# Patient Record
Sex: Male | Born: 1975
Health system: Southern US, Community
[De-identification: ages and names within clinical notes are randomized; demographics above are authoritative.]

## PROBLEM LIST (undated history)

## (undated) DIAGNOSIS — F419 Anxiety disorder, unspecified: Secondary | ICD-10-CM

## (undated) DIAGNOSIS — F32A Depression, unspecified: Secondary | ICD-10-CM

## (undated) DIAGNOSIS — F329 Major depressive disorder, single episode, unspecified: Secondary | ICD-10-CM

## (undated) HISTORY — PX: APPENDECTOMY: SHX54

## (undated) HISTORY — DX: Major depressive disorder, single episode, unspecified: F32.9

## (undated) HISTORY — DX: Anxiety disorder, unspecified: F41.9

## (undated) HISTORY — DX: Depression, unspecified: F32.A

## (undated) HISTORY — PX: WISDOM TOOTH EXTRACTION: SHX21

---

## 2015-12-14 ENCOUNTER — Encounter (HOSPITAL_COMMUNITY): Payer: Self-pay

## 2016-01-14 NOTE — Progress Notes (Signed)
Psychiatric Initial Adult Assessment   Patient Identification: Angel Bell MRN:  [id number] Date of Evaluation:  01/17/2016 Referral Source: self Chief Complaint:   Chief Complaint    Anxiety; New Evaluation    "I've been a worrier" Visit Diagnosis:    ICD-9-CM ICD-10-CM   1. Generalized anxiety disorder 300.02 F41.1     History of Present Illness:   Angel Bell is a 40 year old male with anxiety, presented here to transition care to [place].   He states that he has been feeling anxious since age 72. He has been on Xanax for 14 years and wishes to be weaned off this medication. He feels more anxious when he has tried to discontinue Xanax. He moved from [place] to here due to work and he feels pressure that he needs to do well at work. Although he has been seen at [facility] for 3 months, he wishes to transition care to here. He endorses insomnia, difficulty concentration, irritability and racing thought, which has been worse over the past few months. He denies any significant difficulty at work. He tends to isolate himself and wishes to be by himself, although he admits that he might feel anxious to be with other people. He also reports conflict with his parents due to his sexual orientation.   He denies anhedonia, and enjoys watching TV or travel. He denies SI.and denies previous suicide attempt. He denies AH/VH. He denies panic attack. He denies decreased need for sleep. He drinks occasionally, last drink a month ago. He denies drug use.   Associated Signs/Symptoms: Depression Symptoms:  insomnia, difficulty concentrating, anxiety, (Hypo) Manic Symptoms:  denies Anxiety Symptoms:  Excessive Worry, Psychotic Symptoms:  denies PTSD Symptoms: Negative  Past Psychiatric History:  Outpatient: psychiatrist in [place], used to see [facility] for 3 months Psychiatry admission: never Previous suicide attempt: denies Past trials of medication: Remeron, Prozac, Amitriptyline  (insomnia), gabapentin, Alprazolam, hydroxyzine, melatonin,  Previous Psychotropic Medications: Yes   Substance Abuse History in the last 12 months:  No.  Consequences of Substance Abuse: Negative  Past Medical History:  Past Medical History:  Diagnosis Date  . Anxiety   . Depression     Past Surgical History:  Procedure Laterality Date  . APPENDECTOMY    . WISDOM TOOTH EXTRACTION      Family Psychiatric History: mother- depression, anxiety,   Family History:  Family History  Problem Relation Age of Onset  . Depression Mother     Social History:   Social History   Social History  . Marital status: Single    Spouse name: N/A  . Number of children: N/A  . Years of education: N/A   Social History Main Topics  . Smoking status: Former Smoker    Packs/day: 0.50    Types: Cigarettes    Quit date: 01/16/2009  . Smokeless tobacco: Never Used  . Alcohol use No  . Drug use: No  . Sexual activity: Not Currently   Other Topics Concern  . None   Social History Narrative  . 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 BIOGAIA PROBIOTIC (BIOGAIA PROBIOTIC) LIQD Take  by mouth daily at 8 pm.    . cetirizine (ZYRTEC) 10 MG tablet Take 10 mg by mouth daily.    . cycloSPORINE (RESTASIS) 0.05 % ophthalmic emulsion 1 drop 2 (two) times daily.    Marland Kitchen ezetimibe-simvastatin (VYTORIN) 10-10 MG tablet Take 1 tablet by mouth at bedtime.    . fenofibrate (TRICOR) 145 MG tablet Take 145 mg by mouth daily.    . Flaxseed, Linseed, (FLAXSEED OIL) 1000 MG CAPS Take by mouth.    . gabapentin (NEURONTIN)  300 MG capsule Take 300 mg by mouth 3 (three) times daily.    . hydrOXYzine (ATARAX/VISTARIL) 25 MG tablet Take 25 mg by mouth 2 (two) times daily as needed.    . Multiple Vitamin (MULTIVITAMIN) tablet Take 1 tablet by mouth daily.    . Omega-3 Fatty Acids (FISH OIL) 1000 MG CPDR Take by mouth.    . pentosan polysulfate (ELMIRON) 100 MG capsule Take 100 mg by mouth 3 (three) times daily.    Marland Kitchen triamterene-hydrochlorothiazide (DYAZIDE) 37.5-25 MG capsule Take 1 capsule by mouth daily.    . DULoxetine (CYMBALTA) 30 MG capsule Take 30 mg daily for two weeks, then 60 mg daily 60 capsule 1  . LORazepam (ATIVAN) 0.5 MG tablet Take 1 tablet (0.5 mg total) by mouth 2 (two) times daily. 60 tablet 0   No current facility-administered medications for this visit.     Neurologic: Headache: Yes Seizure: No Paresthesias:No  Musculoskeletal: Strength & Muscle Tone: within normal limits Gait & Station: normal Patient leans: N/A  Psychiatric Specialty Exam: Review of Systems  Musculoskeletal: Positive for back pain.  Neurological: Positive for headaches.  Psychiatric/Behavioral: Negative for depression, hallucinations, substance abuse and suicidal ideas. The patient is nervous/anxious and has insomnia.   All other systems reviewed and are negative.   Blood pressure 130/78, pulse 85, height 5\' 8"  (1.727 m), weight 252 lb 12.8 oz (114.7 kg).Body mass index is 38.44 kg/m.  General Appearance: Well Groomed  Eye Contact:  Good  Speech:  Clear and Coherent  Volume:  Normal  Mood:  Anxious  Affect:  anxious, down  Thought Process:  Coherent and Goal Directed  Orientation:  Full (Time, Place, and Person)  Thought Content:  Logical  Suicidal Thoughts:  No  Homicidal Thoughts:  No  Memory:  Immediate;   Good Recent;   Good Remote;   Good  Judgement:  Good  Insight:  Fair  Psychomotor Activity:  Normal  Concentration:  Concentration: Good and Attention Span: Good  Recall:  Good  Fund of  Knowledge:Good  Language: Good  Akathisia:  No  Handed:  Right  AIMS (if indicated):  N/A  Assets:  Communication Skills Desire for Improvement  ADL's:  Intact  Cognition: WNL  Sleep:  poor   Assessment Angel Bell is a 40 year old with anxiety, presented here to transition care to [place].   # GAD His clinical course is consistent with GAD. Psychosocial stressors including work environment. Will start duloxetine to target his anxiety; hopefully it will be  beneficial for his headache as well. May consider uptitration of gabapentin in the future if he continues to endorse anxiety. Will switch from Xanax to Lorazepam with plan to taper off, given his history of difficulty to wean off this medication. Will discontinue amitriptyline (prescribed for insomnia per report) to avoid polypharmacy. He will greatly benefit from CBT; patient is scheduled to see a therapist.   Plan 1. Start duloxetine 30 mg daily for two weeks,  then 60 mg daily 2. Start lorazepam 0.5 mg twice a day as needed for anxiety 3. Discontinue Xanax 4. Continue Gabapentin 300 mg at night 5. Discontinue hydroxyzine, amitriptyline 6. Return to clinic in one month 7. Patient will see a therapist  Treatment Plan Summary: Plan as above  The patient demonstrates the following risk factors for suicide: Chronic risk factors for suicide include: psychiatric disorder of anxiety. Acute risk factors for suicide include: family or marital conflict. Protective factors for this patient include: coping skills and hope for the future. Considering these factors, the overall suicide risk at this point appears to be low. Patient is appropriate for outpatient follow up.   Norman Clay, MD 10/16/201710:16 AM

## 2016-01-17 ENCOUNTER — Encounter (HOSPITAL_COMMUNITY): Payer: Self-pay | Admitting: Psychiatry

## 2016-01-17 ENCOUNTER — Encounter (INDEPENDENT_AMBULATORY_CARE_PROVIDER_SITE_OTHER): Payer: Self-pay

## 2016-01-17 ENCOUNTER — Ambulatory Visit (INDEPENDENT_AMBULATORY_CARE_PROVIDER_SITE_OTHER): Payer: Commercial Managed Care - HMO | Admitting: Psychiatry

## 2016-01-17 VITALS — BP 130/78 | HR 85 | Ht 68.0 in | Wt 252.8 lb

## 2016-01-17 DIAGNOSIS — Z87891 Personal history of nicotine dependence: Secondary | ICD-10-CM

## 2016-01-17 DIAGNOSIS — F411 Generalized anxiety disorder: Secondary | ICD-10-CM | POA: Diagnosis not present

## 2016-01-17 DIAGNOSIS — Z79899 Other long term (current) drug therapy: Secondary | ICD-10-CM | POA: Diagnosis not present

## 2016-01-17 DIAGNOSIS — Z818 Family history of other mental and behavioral disorders: Secondary | ICD-10-CM | POA: Diagnosis not present

## 2016-01-17 MED ORDER — DULOXETINE HCL 30 MG PO CPEP: ORAL_CAPSULE | ORAL | 1 refills | Status: DC

## 2016-01-17 MED ORDER — LORAZEPAM 0.5 MG PO TABS: 0.5000 mg | ORAL_TABLET | Freq: Two times a day (BID) | ORAL | 0 refills | Status: DC

## 2016-01-17 NOTE — Patient Instructions (Addendum)
1. Start duloxetine 30 mg daily for two weeks, then 60 mg daily 2. Start lorazepam 0.5 mg twice a day as needed for anxiety 3. Discontinue Xanax 4. Continue Gabapentin 300 mg at night 5. Discontinue hydroxyzine 6. Return to clinic in one month

## 2016-01-18 ENCOUNTER — Ambulatory Visit (INDEPENDENT_AMBULATORY_CARE_PROVIDER_SITE_OTHER): Payer: Commercial Managed Care - HMO | Admitting: Psychiatry

## 2016-01-18 DIAGNOSIS — F411 Generalized anxiety disorder: Secondary | ICD-10-CM

## 2016-01-18 NOTE — Progress Notes (Signed)
Comprehensive Clinical Assessment (CCA) Note  01/18/2016 Angel Bell [id number]  Visit Diagnosis:   No diagnosis found.    CCA Part One  Part One has been completed on paper by the patient.  (See scanned document in Chart Review)  CCA Part Two A  Intake/Chief Complaint:  CCA Intake With Chief Complaint CCA Part Two Date: 01/18/16 Chief Complaint/Presenting Problem: anxiety; social anxiety Patients Currently Reported Symptoms/Problems: obsessions; compulsions Collateral Involvement: none Individual's Strengths: enjoys work as a Loss adjuster, chartered of a Research scientist (medical) Abilities: creative Type of Services Patient Feels Are Needed: medication management and individual therapy Initial Clinical Notes/Concerns: social anxiety; poor community networks; introvert and tends to isolate  Mental Health Symptoms Depression:  Depression: Difficulty Concentrating, Fatigue, Irritability, Sleep (too much or little), Tearfulness (6 hours of interrupted sleep)  Mania:  Mania: N/A  Anxiety:   Anxiety: Worrying, Tension, Sleep, Restlessness, Irritability, Fatigue, Difficulty concentrating  Psychosis:  Psychosis: N/A  Trauma:  Trauma: N/A  Obsessions:  Obsessions: Cause anxiety (somatic concerns; obsesses about body pains)  Compulsions:  Compulsions: N/A  Inattention:  Inattention: N/A  Hyperactivity/Impulsivity:  Hyperactivity/Impulsivity: N/A  Oppositional/Defiant Behaviors:  Oppositional/Defiant Behaviors: N/A  Borderline Personality:  Emotional Irregularity: N/A  Other Mood/Personality Symptoms:      Mental Status Exam Appearance and self-care  Stature:  Stature: Average  Weight:  Weight: Overweight  Clothing:  Clothing: Casual  Grooming:  Grooming: Normal  Cosmetic use:  Cosmetic Use: Age appropriate  Posture/gait:  Posture/Gait: Normal  Motor activity:  Motor Activity: Not Remarkable  Sensorium  Attention:  Attention: Normal  Concentration:  Concentration: Normal   Orientation:  Orientation: X5  Recall/memory:  Recall/Memory: Normal  Affect and Mood  Affect:  Affect: Anxious  Mood:  Mood: Anxious  Relating  Eye contact:  Eye Contact: Normal  Facial expression:  Facial Expression: Constricted  Attitude toward examiner:  Attitude Toward Examiner: Cooperative  Thought and Language  Speech flow: Speech Flow: Normal  Thought content:  Thought Content: Appropriate to mood and circumstances  Preoccupation:     Hallucinations:     Organization:     Transport planner of Knowledge:  Fund of Knowledge: Average  Intelligence:  Intelligence: Average  Abstraction:  Abstraction: Normal  Judgement:  Judgement: Normal  Reality Testing:  Reality Testing: Adequate  Insight:  Insight: Fair  Decision Making:  Decision Making: Normal  Social Functioning  Social Maturity:  Social Maturity: Isolates  Social Judgement:  Social Judgement: Normal  Stress  Stressors:  Stressors: Work (Environmental consultant)  Coping Ability:     Skill Deficits:     Supports:      Family and Psychosocial History: Family history Marital status: Single Are you sexually active?: No What is your sexual orientation?: homosexual Does patient have children?: No  Childhood History:  Childhood History By whom was/is the patient raised?: Both parents Description of patient's relationship with caregiver when they were a child: good  Patient's description of current relationship with people who raised him/her: very close with parents Does patient have siblings?: Yes Number of Siblings: 1 Description of patient's current relationship with siblings: good relationship with brother Did patient suffer any verbal/emotional/physical/sexual abuse as a child?: No Did patient suffer from severe childhood neglect?: No Has patient ever been sexually abused/assaulted/raped as an adolescent or adult?: No Was the patient ever a victim of a crime or a disaster?: No Witnessed domestic violence?:  No Has patient been effected by domestic violence as an adult?: No  CCA  Part Two B  Employment/Work Situation: Employment / Work Situation Employment situation: Employed Where is patient currently employed?: [employer]  How long has patient been employed?: 22 years Patient's job has been impacted by current illness: Yes Describe how patient's job has been impacted: very anxious about job performance and how he is perceived by others What is the longest time patient has a held a job?: current job Where was the patient employed at that time?: [employer] Has patient ever been in the [employer]?: No Has patient ever served in combat?: No Did You Receive Any Psychiatric Treatment/Services While in Passenger transport manager?: No Are There Guns or Other Weapons in [place]?: No  Education: Education Did Teacher, adult education From [employer]?: Yes Did [place]?: Yes What Type of College Degree Do you Have?: B.A. What Was Your Major?: art Did You Have Any Special Interests In School?: art Did You Have An Individualized Education Program (IIEP): No Did You Have Any Difficulty At School?: No  Religion: Religion/Spirituality Are You A Religious Person?: Yes What is Your Religious Affiliation?: Christian  Leisure/Recreation: Leisure / Recreation Leisure and Hobbies: travel; Oceanographer; internet  Exercise/Diet: Exercise/Diet Do You Exercise?: Yes What Type of Exercise Do You Do?: Run/Walk How Many Times a Week Do You Exercise?: 4-5 times a week Have You Gained or Lost A Significant Amount of Weight in the Past Six Months?: No Do You Follow a Special Diet?: No Do You Have Any Trouble Sleeping?: Yes Explanation of Sleeping Difficulties: about 6 hours interrupted  CCA Part Two C  Alcohol/Drug Use: Alcohol / Drug Use Pain Medications: none Over the Counter: allergy medication daily; blueberry; flaxseed; fishoil; prelief for bladder History of alcohol / drug use?: No history of alcohol / drug abuse                       CCA Part Three  ASAM's:  Six Dimensions of Multidimensional Assessment  Dimension 1:  Acute Intoxication and/or Withdrawal Potential:     Dimension 2:  Biomedical Conditions and Complications:     Dimension 3:  Emotional, Behavioral, or Cognitive Conditions and Complications:     Dimension 4:  Readiness to Change:     Dimension 5:  Relapse, Continued use, or Continued Problem Potential:     Dimension 6:  Recovery/Living Environment:      Substance use Disorder (SUD)    Social Function:  Social Functioning Social Maturity: Isolates Social Judgement: Normal  Stress:  Stress Stressors: Work (Environmental consultant) Patient Takes Medications The Way The Doctor Instructed?: Yes Priority Risk: Low Acuity  Risk Assessment- Self-Harm Potential: Risk Assessment For Self-Harm Potential Thoughts of Self-Harm: No current thoughts Method: No plan Availability of Means: No access/NA  Risk Assessment -Dangerous to Others Potential: Risk Assessment For Dangerous to Others Potential Method: No Plan Availability of Means: No access or NA Intent: Vague intent or NA Notification Required: No need or identified person  DSM5 Diagnoses: Patient Active Problem List   Diagnosis Date Noted  . Generalized anxiety disorder 01/17/2016    Patient Centered Plan: Patient is on the following Treatment Plan(s):  To be completed by individual therapist.  Recommendations for Services/Supports/Treatments: Recommendations for Services/Supports/Treatments Recommendations For Services/Supports/Treatments: Medication Management, Individual Therapy  Treatment Plan Summary:    Referrals to Alternative Service(s): Referred to Alternative Service(s):   Place:   Date:   Time:    Referred to Alternative Service(s):   Place:   Date:   Time:  Referred to Alternative Service(s):   Place:   Date:   Time:    Referred to Alternative Service(s):   Place:   Date:   Time:     Nancie Neas

## 2016-01-26 ENCOUNTER — Other Ambulatory Visit: Payer: Self-pay | Admitting: Family Medicine

## 2016-01-26 DIAGNOSIS — R740 Nonspecific elevation of levels of transaminase and lactic acid dehydrogenase [LDH]: Principal | ICD-10-CM

## 2016-01-26 DIAGNOSIS — R7401 Elevation of levels of liver transaminase levels: Secondary | ICD-10-CM

## 2016-02-16 ENCOUNTER — Ambulatory Visit (HOSPITAL_COMMUNITY): Payer: Commercial Managed Care - HMO | Admitting: Psychiatry

## 2016-02-16 NOTE — Progress Notes (Signed)
[place] MD/PA/NP OP Progress Note  02/21/2016 9:38 AM Angel Bell  MRN:  [id number]  Chief Complaint:  Chief Complaint    Anxiety; Follow-up     Subjective:  "I feel a lot better." HPI:  Patient presents for follow up appointment. He states that his mood has improved significantly and denies any panic attack. He is able to deal things well at work. Although he tends to be by himself outside work, he is content. He has been taking Ativan twice a day (did not recognize prn prescription).   He occasionally had insomnia. He denies SI. He drinks alcohol a couple of beers very occasionally. He denies any change since discontinuing Xanax, hydroxyzine and amitriptyline. He continues to take gabapentine 300 mg TID to prevent biting a tongue.   Visit Diagnosis:    ICD-9-CM ICD-10-CM   1. Generalized anxiety disorder 300.02 F41.1     Past Psychiatric History:  Outpatient: psychiatrist in [place], used to see [facility] for 3 months Psychiatry admission: never Previous suicide attempt: denies Past trials of medication: Remeron, Prozac, Amitriptyline (insomnia), gabapentin, Alprazolam, hydroxyzine, melatonin,  Past Medical History:  Past Medical History:  Diagnosis Date  . Anxiety   . Depression     Past Surgical History:  Procedure Laterality Date  . APPENDECTOMY    . WISDOM TOOTH EXTRACTION      Family Psychiatric History:  mother- depression, anxiety,   Family History:  Family History  Problem Relation Age of Onset  . Depression Mother     Social History:  Social History   Social History  . Marital status: Single    Spouse name: N/A  . Number of children: N/A  . Years of education: N/A   Social History Main Topics  . Smoking status: Former Smoker    Packs/day: 0.50    Types: Cigarettes    Quit date: 01/16/2009  . Smokeless tobacco: Never Used  . Alcohol use No  . Drug use: No  . Sexual activity: Not Currently   Other Topics Concern  . None   Social  History Narrative  . None    Allergies: No Known Allergies  Metabolic Disorder Labs: No results found for: HGBA1C, MPG No results found for: PROLACTIN No results found for: CHOL, TRIG, HDL, CHOLHDL, VLDL, LDLCALC   Current Medications: Current Outpatient Prescriptions  Medication Sig Dispense Refill  . atorvastatin (LIPITOR) 10 MG tablet Take 10 mg by mouth daily.  3  . BIOGAIA PROBIOTIC (BIOGAIA PROBIOTIC) LIQD Take by mouth daily at 8 pm.    . cetirizine (ZYRTEC) 10 MG tablet Take 10 mg by mouth daily.    . cycloSPORINE (RESTASIS) 0.05 % ophthalmic emulsion 1 drop 2 (two) times daily.    . DULoxetine (CYMBALTA) 60 MG capsule Take 30 mg daily for two weeks, then 60 mg daily 90 capsule 0  . ezetimibe-simvastatin (VYTORIN) 10-10 MG tablet Take 1 tablet by mouth at bedtime.    . fenofibrate (TRICOR) 145 MG tablet Take 145 mg by mouth daily.    . Flaxseed, Linseed, (FLAXSEED OIL) 1000 MG CAPS Take by mouth.    . gabapentin (NEURONTIN) 300 MG capsule Take 300 mg by mouth 3 (three) times daily.    Marland Kitchen LORazepam (ATIVAN) 0.5 MG tablet Take 1 tablet (0.5 mg total) by mouth 2 (two) times daily as needed for anxiety. 30 tablet 0  . Multiple Vitamin (MULTIVITAMIN) tablet Take 1 tablet by mouth daily.    . Omega-3 Fatty Acids (FISH OIL) 1000 MG CPDR  Take by mouth.    . pentosan polysulfate (ELMIRON) 100 MG capsule Take 100 mg by mouth 3 (three) times daily.    Marland Kitchen triamterene-hydrochlorothiazide (DYAZIDE) 37.5-25 MG capsule Take 1 capsule by mouth daily.    Marland Kitchen ezetimibe (ZETIA) 10 MG tablet Take 10 mg by mouth daily.  2   No current facility-administered medications for this visit.     Neurologic: Headache: No Seizure: No Paresthesias: No  Musculoskeletal: Strength & Muscle Tone: within normal limits Gait & Station: normal Patient leans: N/A  Psychiatric Specialty Exam: Review of Systems  Musculoskeletal: Positive for joint pain.  Psychiatric/Behavioral: Negative for depression,  hallucinations, substance abuse and suicidal ideas. The patient has insomnia. The patient is not nervous/anxious.   All other systems reviewed and are negative.   Blood pressure 130/82, pulse (!) 102, height 5\' 8"  (1.727 m), weight 251 lb (113.9 kg).Body mass index is 38.16 kg/m.  General Appearance: Casual  Eye Contact:  Good  Speech:  Normal Rate  Volume:  Normal  Mood:  "good"\  Affect:  less anxious  Thought Process:  Coherent and Goal Directed  Orientation:  Full (Time, Place, and Person)  Thought Content: Logical  Perceptions: denies AH/VH  Suicidal Thoughts:  No  Homicidal Thoughts:  No  Memory:  Immediate;   Good Recent;   Good Remote;   Good  Judgement:  Good  Insight:  Good  Psychomotor Activity:  Normal  Concentration:  Concentration: Good and Attention Span: Good  Recall:  Good  Fund of Knowledge: Good  Language: Good  Akathisia:  NA  Handed:  Right  AIMS (if indicated):  N/A  Assets:  Communication Skills Desire for Improvement  ADL's:  Intact  Cognition: WNL  Sleep:  fair   Assessment Angel Bell is a 40 year old with anxiety, presented here to transition care to [place]. Psychosocial stressors including work environment.   # GAD There has been significant improvement in his anxiety which coincided with starting duloxetine. Will continue current dose. Will plan to taper off Ativan given he denies significant anxiety. He will greatly benefit from CBT; patient is scheduled to see a therapist.   Plan 1. Continue duloxetine 60 mg daily 2. 2. Decrease lorazepam 0.5 mg daily for one week, then discontinue (Patient is on Gabapentin 300 mg at night) 3. Return to clinic in one month 4. Patient will see a therapist  Treatment Plan Summary: Plan as above  The patient demonstrates the following risk factors for suicide: Chronic risk factors for suicide include: psychiatric disorder of anxiety. Acute risk factors for suicide include: family or marital  conflict. Protective factors for this patient include: coping skills and hope for the future. Considering these factors, the overall suicide risk at this point appears to be low. Patient is appropriate for outpatient follow up.  Norman Clay, MD 02/21/2016, 9:38 AM

## 2016-02-18 ENCOUNTER — Telehealth (HOSPITAL_COMMUNITY): Payer: Self-pay

## 2016-02-18 ENCOUNTER — Other Ambulatory Visit (HOSPITAL_COMMUNITY): Payer: Self-pay | Admitting: Psychiatry

## 2016-02-18 MED ORDER — LORAZEPAM 0.5 MG PO TABS: 0.5000 mg | ORAL_TABLET | Freq: Two times a day (BID) | ORAL | 0 refills | Status: DC | PRN

## 2016-02-18 NOTE — Telephone Encounter (Signed)
Medication refill - Telephone call with patient to inform his order was called into his pharmacy and reminded patient of our new outpatient address for appointment 02/21/16.

## 2016-02-18 NOTE — Telephone Encounter (Signed)
I called in the pharmacy. Ordered Ativan 0.5 mg BID prn for anxiety, dispense for 30 tabs. Could you inform the patient? Thanks!  [id number] - [place], [place] [address] AT [address]

## 2016-02-18 NOTE — Telephone Encounter (Signed)
Medication refill - Patient called requesting a refill of his Ativan as states he does not have enough until follow up appointment 02/21/16.Stated he has 1 pill remaining and needs an order to be called into [facility]. Medication refill - Patient called requesting a refill of his Ativan as states he does not have enough until follow up appointment 02/21/16.Stated he has 1 pill remaining and needs an order to be called into his [facility] pharmacy. States he plans to keep appointment on 02/21/16.

## 2016-02-21 ENCOUNTER — Ambulatory Visit (INDEPENDENT_AMBULATORY_CARE_PROVIDER_SITE_OTHER): Payer: Commercial Managed Care - HMO | Admitting: Psychiatry

## 2016-02-21 ENCOUNTER — Encounter (HOSPITAL_COMMUNITY): Payer: Self-pay | Admitting: Psychiatry

## 2016-02-21 VITALS — BP 130/82 | HR 102 | Ht 68.0 in | Wt 251.0 lb

## 2016-02-21 DIAGNOSIS — Z818 Family history of other mental and behavioral disorders: Secondary | ICD-10-CM

## 2016-02-21 DIAGNOSIS — Z87891 Personal history of nicotine dependence: Secondary | ICD-10-CM | POA: Diagnosis not present

## 2016-02-21 DIAGNOSIS — Z79899 Other long term (current) drug therapy: Secondary | ICD-10-CM | POA: Diagnosis not present

## 2016-02-21 DIAGNOSIS — F411 Generalized anxiety disorder: Secondary | ICD-10-CM

## 2016-02-21 MED ORDER — LORAZEPAM 0.5 MG PO TABS: 0.5000 mg | ORAL_TABLET | Freq: Two times a day (BID) | ORAL | 0 refills | Status: DC | PRN

## 2016-02-21 MED ORDER — DULOXETINE HCL 60 MG PO CPEP: ORAL_CAPSULE | ORAL | 0 refills | Status: DC

## 2016-02-21 NOTE — Patient Instructions (Addendum)
1. Continue duloxetine 60 mg daily 2. Decrease lorazepam 0.5 mg daily for one week, then discontinue 3. Return to clinic in one month

## 2016-03-08 ENCOUNTER — Ambulatory Visit (HOSPITAL_COMMUNITY): Payer: Commercial Managed Care - HMO | Admitting: Psychiatry

## 2016-03-20 ENCOUNTER — Other Ambulatory Visit (HOSPITAL_COMMUNITY): Payer: Self-pay | Admitting: Psychiatry

## 2016-03-20 ENCOUNTER — Telehealth (HOSPITAL_COMMUNITY): Payer: Self-pay

## 2016-03-20 MED ORDER — GABAPENTIN 300 MG PO CAPS: 300.0000 mg | ORAL_CAPSULE | Freq: Every day | ORAL | 0 refills | Status: DC

## 2016-03-20 MED ORDER — GABAPENTIN 300 MG PO CAPS: 300.0000 mg | ORAL_CAPSULE | Freq: Three times a day (TID) | ORAL | 0 refills | Status: DC

## 2016-03-20 NOTE — Telephone Encounter (Signed)
Patient takes 300 mg Gabapentin at bedtime, we have never filled it because he has always had enough. Patient states he now needs the refill, please review and advise, thank you

## 2016-03-20 NOTE — Telephone Encounter (Signed)
Called patient back and confirmed with him that he is taking 300 mg at night only. I sent the order to the pharmacy, patients insurance requires a 90 day or they will not cover, so I sent in a 90 day.

## 2016-03-20 NOTE — Telephone Encounter (Signed)
Please verify gabapentin dose with patient; I believe that he is taking  300 mg TID when I saw him last (I missed to update it in my previous note.) I ordered gabapentin 300 mg TID for a month. If he is only taking 300 mg qhs, we should continue that regimen.

## 2016-04-12 ENCOUNTER — Ambulatory Visit (INDEPENDENT_AMBULATORY_CARE_PROVIDER_SITE_OTHER): Payer: 59 | Admitting: Psychiatry

## 2016-04-12 DIAGNOSIS — F411 Generalized anxiety disorder: Secondary | ICD-10-CM | POA: Diagnosis not present

## 2016-04-13 NOTE — Progress Notes (Signed)
   THERAPIST PROGRESS NOTE  Session Time: 2:35-3:30  Participation Level: Active  Behavioral Response: CasualAlertEuthymic  Type of Therapy: Individual Therapy  Treatment Goals addressed: Coping; developing self-care  Interventions: CBT and Solution Focused  Summary: Angel Bell is a 41 y.o. male who presents with generalized anxiety disorder.   Suicidal/Homicidal: Nowithout intent/plan  Therapist Response: Pt. Reports that he is taking PRN medication for his anxiety and has helped. Pt. Processed recent healthcare and work related stressors. Pt. Reports that he feels that he has been able to cope much better, is working through his persistent thoughts. Significant part of session focused on Pt.'s desires for the future I.e. Feeling more settled in his home and career and thoughts that are barriers to developing more permanency I.e., fear that he will transferred. Pt. Discussed vacation plans and general self-care which includes spending much time by himself and regional travel. Pt. Develops self-awareness around patterns of isolation and understands how "snarky" comments and personality are protective mechanism. Pt. Continues to not see the need to encourage relationships and feels that he is happy and safe without relationships with friend or significant other.  Plan: Return again in 4 weeks.  Diagnosis: Axis I: Anxiety Disorder NOS    Axis II: No diagnosis    Nancie Neas, [facility] 04/13/2016

## 2016-04-16 NOTE — Progress Notes (Addendum)
[place] MD/PA/NP OP Progress Note  04/17/2016 10:58 AM Angel Bell  MRN:  [id number]  Chief Complaint:  Chief Complaint    Depression; Follow-up     Subjective:  "I'm doing fine." HPI:  Patient presents for follow up appointment. He states that he is doing well, although he feels exhausted today as he was busy yesterday at work. He believes that he is functioning well at work. He tends to be isolative after work, but he feels content. He has not taken Ativan for a month. He believes that he handles his anxiety better than he was on Xanax. He will visit his family in [place].  He endorses insomnia with occasional hypersomnia. He denies SI. He continues to take gabapentin 300 mg qhs to prevent biting a tongue.  Visit Diagnosis:    ICD-9-CM ICD-10-CM   1. Generalized anxiety disorder 300.02 F41.1     Past Psychiatric History:  Outpatient: psychiatrist in [place], used to see Beverly Sessions for 3 months Psychiatry admission: never Previous suicide attempt: denies Past trials of medication: Remeron, Prozac, Amitriptyline (insomnia), gabapentin, Alprazolam, hydroxyzine, melatonin,  Past Medical History:  Past Medical History:  Diagnosis Date  . Anxiety   . Depression     Past Surgical History:  Procedure Laterality Date  . APPENDECTOMY    . WISDOM TOOTH EXTRACTION      Family Psychiatric History:  mother- depression, anxiety,   Family History:  Family History  Problem Relation Age of Onset  . Depression Mother     Social History:  Social History   Social History  . Marital status: Single    Spouse name: N/A  . Number of children: N/A  . Years of education: N/A   Social History Main Topics  . Smoking status: Former Smoker    Packs/day: 0.50    Types: Cigarettes    Quit date: 01/16/2009  . Smokeless tobacco: Never Used  . Alcohol use No  . Drug use: No  . Sexual activity: Not Currently   Other Topics Concern  . None   Social History Narrative  . None     Allergies: No Known Allergies  Metabolic Disorder Labs: No results found for: HGBA1C, MPG No results found for: PROLACTIN No results found for: CHOL, TRIG, HDL, CHOLHDL, VLDL, LDLCALC   Current Medications: Current Outpatient Prescriptions  Medication Sig Dispense Refill  . atorvastatin (LIPITOR) 10 MG tablet Take 10 mg by mouth daily.  3  . BIOGAIA PROBIOTIC (BIOGAIA PROBIOTIC) LIQD Take by mouth daily at 8 pm.    . cetirizine (ZYRTEC) 10 MG tablet Take 10 mg by mouth daily.    . cycloSPORINE (RESTASIS) 0.05 % ophthalmic emulsion 1 drop 2 (two) times daily.    . DULoxetine (CYMBALTA) 60 MG capsule Take 30 mg daily for two weeks, then 60 mg daily 90 capsule 0  . ezetimibe-simvastatin (VYTORIN) 10-10 MG tablet Take 1 tablet by mouth at bedtime.    . fenofibrate (TRICOR) 145 MG tablet Take 145 mg by mouth daily.    . Flaxseed, Linseed, (FLAXSEED OIL) 1000 MG CAPS Take by mouth.    . gabapentin (NEURONTIN) 300 MG capsule Take 1 capsule (300 mg total) by mouth at bedtime. 90 capsule 0  . Multiple Vitamin (MULTIVITAMIN) tablet Take 1 tablet by mouth daily.    . Omega-3 Fatty Acids (FISH OIL) 1000 MG CPDR Take by mouth.    . pentosan polysulfate (ELMIRON) 100 MG capsule Take 100 mg by mouth 3 (three) times daily.    Marland Kitchen  triamterene-hydrochlorothiazide (DYAZIDE) 37.5-25 MG capsule Take 1 capsule by mouth daily.    Marland Kitchen ezetimibe (ZETIA) 10 MG tablet Take 10 mg by mouth daily.  2   No current facility-administered medications for this visit.     Neurologic: Headache: No Seizure: No Paresthesias: No  Musculoskeletal: Strength & Muscle Tone: within normal limits Gait & Station: normal Patient leans: N/A  Psychiatric Specialty Exam: Review of Systems  Musculoskeletal: Positive for joint pain.  Psychiatric/Behavioral: Negative for depression, hallucinations, substance abuse and suicidal ideas. The patient is nervous/anxious and has insomnia.   All other systems reviewed and are  negative.   Blood pressure (!) 158/102, pulse (!) 104, height 5\' 8"  (1.727 m), weight 258 lb (117 kg).Body mass index is 39.23 kg/m.  General Appearance: Casual  Eye Contact:  Good  Speech:  Normal Rate  Volume:  Normal  Mood:  "good"\  Affect:  down  Thought Process:  Coherent and Goal Directed  Orientation:  Full (Time, Place, and Person)  Thought Content: Logical  Perceptions: denies AH/VH  Suicidal Thoughts:  No  Homicidal Thoughts:  No  Memory:  Immediate;   Good Recent;   Good Remote;   Good  Judgement:  Good  Insight:  Good  Psychomotor Activity:  Normal  Concentration:  Concentration: Good and Attention Span: Good  Recall:  Good  Fund of Knowledge: Good  Language: Good  Akathisia:  NA  Handed:  Right  AIMS (if indicated):  N/A  Assets:  Communication Skills Desire for Improvement  ADL's:  Intact  Cognition: WNL  Sleep:  poor   Assessment STEELE ZIMMERLY is a 41 year old with anxiety, presented here to transition care to [place]. Psychosocial stressors including work environment.   # GAD There has been significant improvement in his anxiety which coincided with starting duloxetine. Will continue current dose. Ativan was tapered off without significant withdrawal symptoms.  He will greatly benefit from CBT; referral made.  Plan 1. Continue duloxetine 60 mg daily 2. Gabapentin 300 mg at night 3. Return to clinic in 2 months 4. Patient will see a therapist  Treatment Plan Summary: Plan as above  The patient demonstrates the following risk factors for suicide: Chronic risk factors for suicide include: psychiatric disorder of anxiety. Acute risk factors for suicide include: family or marital conflict. Protective factors for this patient include: coping skills and hope for the future. Considering these factors, the overall suicide risk at this point appears to be low. Patient is appropriate for outpatient follow up.  Norman Clay, MD 04/17/2016, 10:58 AM

## 2016-04-17 ENCOUNTER — Ambulatory Visit (INDEPENDENT_AMBULATORY_CARE_PROVIDER_SITE_OTHER): Payer: Commercial Managed Care - HMO | Admitting: Psychiatry

## 2016-04-17 ENCOUNTER — Encounter (HOSPITAL_COMMUNITY): Payer: Self-pay | Admitting: Psychiatry

## 2016-04-17 VITALS — BP 158/102 | HR 104 | Ht 68.0 in | Wt 258.0 lb

## 2016-04-17 DIAGNOSIS — Z87891 Personal history of nicotine dependence: Secondary | ICD-10-CM | POA: Diagnosis not present

## 2016-04-17 DIAGNOSIS — F411 Generalized anxiety disorder: Secondary | ICD-10-CM

## 2016-04-17 DIAGNOSIS — Z818 Family history of other mental and behavioral disorders: Secondary | ICD-10-CM

## 2016-04-17 DIAGNOSIS — Z79899 Other long term (current) drug therapy: Secondary | ICD-10-CM

## 2016-04-17 MED ORDER — DULOXETINE HCL 60 MG PO CPEP: ORAL_CAPSULE | ORAL | 0 refills | Status: DC

## 2016-04-17 MED ORDER — GABAPENTIN 300 MG PO CAPS: 300.0000 mg | ORAL_CAPSULE | Freq: Every day | ORAL | 0 refills | Status: DC

## 2016-04-17 NOTE — Patient Instructions (Addendum)
1. Continue duloxetine 60 mg daily 2. Gabapentin 300 mg at night 3. Return to clinic in 2 months 4. Continue to see a therapist

## 2016-05-24 ENCOUNTER — Ambulatory Visit (INDEPENDENT_AMBULATORY_CARE_PROVIDER_SITE_OTHER): Payer: Commercial Managed Care - HMO | Admitting: Psychiatry

## 2016-05-24 DIAGNOSIS — F411 Generalized anxiety disorder: Secondary | ICD-10-CM | POA: Diagnosis not present

## 2016-05-30 NOTE — Progress Notes (Signed)
   THERAPIST PROGRESS NOTE  Session Time: 2:35-3:30  Participation Level: Active  Behavioral Response: CasualAlertAnxious  Type of Therapy: Individual Therapy  Treatment Goals addressed: Coping; developing self-care  Interventions: CBT and Solution Focused  Summary: Angel Bell is a 41 y.o. male who presents with generalized anxiety disorder.   Suicidal/Homicidal: Nowithout intent/plan  Therapist Response: Pt. Reports that he continues to feel anxious and that his work environment is his most significant trigger. Pt. Reports that he is challenged by back pain caused by the physical nature of his work. Pt. Discussed that he enjoys his work, but because his work is creative and subject to public critique that he feels that his mood is vulnerable to the others' opinions about his work. Pt. Was encouraged to practice grounding and engage in daily journal to develop self-awareness. Pt. Was encouraged by the counselor to use daily prompt "what do I know to be true about myself today" to help him ground and develop resistance to the opinions that others may have about his work. Pt. Discussed that he is taking his medication for anxiety as prescribed, but feels that he was better able to manage on 2/30 mg of duloxetine than 1/60 mg of duloxetine. Pt. And counselor completed treatment plan.  Plan: Return again in 4 weeks.  Diagnosis:      Axis I: Anxiety Disorder NOS                          Axis II: No diagnosis    Nancie Neas, [employer] 05/30/2016

## 2016-06-19 ENCOUNTER — Ambulatory Visit (HOSPITAL_COMMUNITY): Payer: Commercial Managed Care - HMO | Admitting: Psychiatry

## 2016-06-20 ENCOUNTER — Ambulatory Visit (HOSPITAL_COMMUNITY): Payer: Commercial Managed Care - HMO | Admitting: Psychiatry

## 2016-07-12 ENCOUNTER — Ambulatory Visit (INDEPENDENT_AMBULATORY_CARE_PROVIDER_SITE_OTHER): Payer: 59 | Admitting: Psychiatry

## 2016-07-12 DIAGNOSIS — F411 Generalized anxiety disorder: Secondary | ICD-10-CM

## 2016-07-14 ENCOUNTER — Ambulatory Visit (INDEPENDENT_AMBULATORY_CARE_PROVIDER_SITE_OTHER): Payer: Commercial Managed Care - HMO | Admitting: Psychiatry

## 2016-07-14 DIAGNOSIS — F411 Generalized anxiety disorder: Secondary | ICD-10-CM | POA: Diagnosis not present

## 2016-07-14 DIAGNOSIS — Z79899 Other long term (current) drug therapy: Secondary | ICD-10-CM | POA: Diagnosis not present

## 2016-07-14 DIAGNOSIS — Z87891 Personal history of nicotine dependence: Secondary | ICD-10-CM

## 2016-07-14 DIAGNOSIS — Z818 Family history of other mental and behavioral disorders: Secondary | ICD-10-CM | POA: Diagnosis not present

## 2016-07-14 MED ORDER — DULOXETINE HCL 40 MG PO CPEP: 80.0000 mg | ORAL_CAPSULE | Freq: Every day | ORAL | 2 refills | Status: DC

## 2016-07-14 MED ORDER — HYDROXYZINE PAMOATE 25 MG PO CAPS: 25.0000 mg | ORAL_CAPSULE | Freq: Every day | ORAL | 2 refills | Status: DC

## 2016-07-14 NOTE — Progress Notes (Signed)
   THERAPIST PROGRESS NOTE  Session Time: 3:35-4:30  Participation Level:Active  Behavioral Response:CasualAlertAnxious  Type of Therapy: Individual Therapy  Treatment Goals addressed: Coping; developing self-care  Interventions:CBT and Solution Focused  Summary: Angel Bell a 41 y.o.malewho presents with generalized anxiety disorder.   Suicidal/Homicidal:Nowithout intent/plan  Therapist Response: Pt. Reports with improved affect and lowered anxiety compared to last session. Pt. Discussed that his perceived criticism about his work continues to be most significant trigger for his anxiety. However, he is able to go to lunch with co-workers daily and his anxiety otherwise does not interrupt his functioning at work. Pt. Discussed that he is planning a trip with a friend to [place] in the next year. Pt. Discussed recent return from vacation he took by himself to the beach and that he had a good time. Pt. Discussed concern since his return of rash on his arms and hands that was causing him to be anxious. Pt. Was encouraged to talk about the following symptoms in upcoming appointment with the psychiatrist-continued problems with sleep with current medication, racing thoughts at bedtime, ringing in his ears, ticks with his anxiety, and genetic testing for his psychiatric medications.   Plan: Return again in 4weeks.  Diagnosis:Axis I:Anxiety Disorder NOS  Axis II:No diagnosis   Angel Bell, [facility] 07/14/2016

## 2016-07-14 NOTE — Progress Notes (Signed)
[place] MD/PA/NP OP Progress Note  07/14/2016 10:39 AM Angel Bell  MRN:  [id number]  Chief Complaint: anxiety Subjective: Angel Bell presents today for psychiatric follow-up. He transfers care from prior psychiatrist in this office. He continues in therapy with Anderson Malta. They're working on some coping strategies and cognitive strategies for battling anxiety. He continues to present as generally a worried individual, preoccupied with his physical sensations, body sensations, and various health concerns. He reports that Cymbalta has been helpful for reducing his anxiety and he definitely has less doctors visits than he use to because he is not over worried about minor physical issues. He reports that he recently made a dermatology appointment because he noticed a skin rash. He reports that in the past he would make a doctor's appointment and even had minor chest pain or acid reflux, because he would be convinced that he had a heart attack.  He asks Probation officer about some experiences of tinnitus that he's been having for a couple weeks. It appears that his father has this, and he may be biologically predisposed to tinnitus. I reassured him, and we discussed some appropriate monitoring of this fits to worsen her not go away. We discussed increasing Cymbalta to 80 mg daily, all in the morning. He has tolerated Cymbalta well, and reports that he has felt benefits from it. He continues to have some difficulty sleeping due to anxiety and worry, and we discussed using a low dose of Vistaril at night for sleep. He denies any acute safety issues, and denies any significant depressive symptoms. Will follow-up in 3 months.  Visit Diagnosis:    ICD-9-CM ICD-10-CM   1. GAD (generalized anxiety disorder) 300.02 F41.1 hydrOXYzine (VISTARIL) 25 MG capsule     DULoxetine HCl 40 MG CPEP     DISCONTINUED: DULoxetine 40 MG CPEP    Past Psychiatric History: See intake H&P for full details. Reviewed, with no updates at  this time.   Past Medical History:  Past Medical History:  Diagnosis Date  . Anxiety   . Depression     Past Surgical History:  Procedure Laterality Date  . APPENDECTOMY    . WISDOM TOOTH EXTRACTION      Family Psychiatric History: See intake H&P for full details. Reviewed, with no updates at this time.   Family History:  Family History  Problem Relation Age of Onset  . Depression Mother     Social History:  Social History   Social History  . Marital status: Single    Spouse name: N/A  . Number of children: N/A  . Years of education: N/A   Social History Main Topics  . Smoking status: Former Smoker    Packs/day: 0.50    Types: Cigarettes    Quit date: 01/16/2009  . Smokeless tobacco: Never Used  . Alcohol use No  . Drug use: No  . Sexual activity: Not Currently   Other Topics Concern  . Not on file   Social History Narrative  . No narrative on file    Allergies: No Known Allergies  Metabolic Disorder Labs: No results found for: HGBA1C, MPG No results found for: PROLACTIN No results found for: CHOL, TRIG, HDL, CHOLHDL, VLDL, LDLCALC   Current Medications: Current Outpatient Prescriptions  Medication Sig Dispense Refill  . atorvastatin (LIPITOR) 10 MG tablet Take 10 mg by mouth daily.  3  . BIOGAIA PROBIOTIC (BIOGAIA PROBIOTIC) LIQD Take by mouth daily at 8 pm.    . cetirizine (ZYRTEC) 10 MG tablet  Take 10 mg by mouth daily.    . cycloSPORINE (RESTASIS) 0.05 % ophthalmic emulsion 1 drop 2 (two) times daily.    . DULoxetine HCl 40 MG CPEP Take 80 mg by mouth daily. 180 capsule 2  . ezetimibe (ZETIA) 10 MG tablet Take 10 mg by mouth daily.  2  . ezetimibe-simvastatin (VYTORIN) 10-10 MG tablet Take 1 tablet by mouth at bedtime.    . fenofibrate (TRICOR) 145 MG tablet Take 145 mg by mouth daily.    . Flaxseed, Linseed, (FLAXSEED OIL) 1000 MG CAPS Take by mouth.    . gabapentin (NEURONTIN) 300 MG capsule Take 1 capsule (300 mg total) by mouth at bedtime.  90 capsule 0  . hydrOXYzine (VISTARIL) 25 MG capsule Take 1 capsule (25 mg total) by mouth at bedtime. 30 capsule 2  . Multiple Vitamin (MULTIVITAMIN) tablet Take 1 tablet by mouth daily.    . Omega-3 Fatty Acids (FISH OIL) 1000 MG CPDR Take by mouth.    . pentosan polysulfate (ELMIRON) 100 MG capsule Take 100 mg by mouth 3 (three) times daily.    Marland Kitchen triamterene-hydrochlorothiazide (DYAZIDE) 37.5-25 MG capsule Take 1 capsule by mouth daily.     No current facility-administered medications for this visit.     Neurologic: Headache: Negative Seizure: Negative Paresthesias: Negative  Musculoskeletal: Strength & Muscle Tone: within normal limits Gait & Station: normal Patient leans: N/A  Psychiatric Specialty Exam: ROS  There were no vitals taken for this visit.There is no height or weight on file to calculate BMI.  General Appearance: Casual and Fairly Groomed  Eye Contact:  Good  Speech:  Clear and Coherent  Volume:  Normal  Mood:  worried, baseline  Affect:  Appropriate and Congruent  Thought Process:  Goal Directed  Orientation:  Full (Time, Place, and Person)  Thought Content: Logical and Rumination   Suicidal Thoughts:  No  Homicidal Thoughts:  No  Memory:  Immediate;   Good  Judgement:  Fair  Insight:  Fair  Psychomotor Activity:  Normal  Concentration:  Concentration: Fair  Recall:  NA  Fund of Knowledge: Good  Language: Good  Akathisia:  Negative  Handed:  Right  AIMS (if indicated):  na  Assets:  Communication Skills Desire for Improvement Financial Resources/Insurance Housing Social Support Transportation Vocational/Educational  ADL's:  Intact  Cognition: WNL  Sleep:  7 hours nightly   Treatment Plan Summary: Angel Bell is a 41 year old male with a history of generalized anxiety disorder, and symptoms of hypochondriasis who presents today for psychiatric med management follow-up. He appears to have had some benefits from Cymbalta, and we will titrate  to 80 mg daily. No acute safety issues and will follow up in 3 months.  1. GAD (generalized anxiety disorder)    Cymbalta increased to 80 mg daily Continue in therapy at this clinic Vistaril 25 mg nightly when necessary for sleep Follow-up with writer in 3 months  Aundra Dubin, MD 07/14/2016, 10:39 AM

## 2016-08-24 ENCOUNTER — Ambulatory Visit (HOSPITAL_COMMUNITY): Payer: Commercial Managed Care - HMO | Admitting: Psychiatry

## 2016-10-12 ENCOUNTER — Encounter (HOSPITAL_COMMUNITY): Payer: Self-pay | Admitting: Psychiatry

## 2016-10-12 ENCOUNTER — Ambulatory Visit (INDEPENDENT_AMBULATORY_CARE_PROVIDER_SITE_OTHER): Payer: 59 | Admitting: Psychiatry

## 2016-10-12 VITALS — BP 150/100 | HR 99 | Ht 68.0 in | Wt 257.0 lb

## 2016-10-12 DIAGNOSIS — F411 Generalized anxiety disorder: Secondary | ICD-10-CM | POA: Diagnosis not present

## 2016-10-12 DIAGNOSIS — Z818 Family history of other mental and behavioral disorders: Secondary | ICD-10-CM | POA: Diagnosis not present

## 2016-10-12 DIAGNOSIS — Z87891 Personal history of nicotine dependence: Secondary | ICD-10-CM

## 2016-10-12 DIAGNOSIS — F4521 Hypochondriasis: Secondary | ICD-10-CM | POA: Diagnosis not present

## 2016-10-12 MED ORDER — DULOXETINE HCL 40 MG PO CPEP: 80.0000 mg | ORAL_CAPSULE | Freq: Every day | ORAL | 1 refills | Status: DC

## 2016-10-12 MED ORDER — GABAPENTIN 300 MG PO CAPS: 300.0000 mg | ORAL_CAPSULE | Freq: Every day | ORAL | 1 refills | Status: DC

## 2016-10-12 NOTE — Progress Notes (Signed)
[facility] MD/PA/NP OP Progress Note  10/12/2016 8:15 AM Angel Bell  MRN:  [id number]  Chief Complaint: med follow-up, anxiety  Subjective:  Angel Bell reports that his mood is been better with the Cymbalta. He reports that he continues to have some sleep-related anxiety and insomnia. We discussed some CBT-I skills and I provided him with a worksheet on this. Receptive to working on this with Angel Bell in therapy.  He denies any safety concerns. Eyes any significant side effects from Cymbalta and gabapentin.  He reports that he is trying to work on being more social and getting out of the house more, but he is also quite content with being introverted and staying at home on the weekends. He continues to work at [employer] and loves his job. Denies any other significant concerns and agrees to follow-up in 4 months.  Visit Diagnosis:    ICD-10-CM   1. GAD (generalized anxiety disorder) F41.1     Past Psychiatric History: See intake H&P for full details. Reviewed, with no updates at this time.  Past Medical History:  Past Medical History:  Diagnosis Date  . Anxiety   . Depression     Past Surgical History:  Procedure Laterality Date  . APPENDECTOMY    . WISDOM TOOTH EXTRACTION      Family Psychiatric History: See intake H&P for full details. Reviewed, with no updates at this time.   Family History:  Family History  Problem Relation Age of Onset  . Depression Mother     Social History:  Social History   Social History  . Marital status: Single    Spouse name: N/A  . Number of children: N/A  . Years of education: N/A   Social History Main Topics  . Smoking status: Former Smoker    Packs/day: 0.50    Types: Cigarettes    Quit date: 01/16/2009  . Smokeless tobacco: Never Used  . Alcohol use No  . Drug use: No  . Sexual activity: Not Currently   Other Topics Concern  . Not on file   Social History Narrative  . No narrative on file    Allergies: No Known  Allergies  Metabolic Disorder Labs: No results found for: HGBA1C, MPG No results found for: PROLACTIN No results found for: CHOL, TRIG, HDL, CHOLHDL, VLDL, LDLCALC   Current Medications: Current Outpatient Prescriptions  Medication Sig Dispense Refill  . atorvastatin (LIPITOR) 10 MG tablet Take 10 mg by mouth daily.  3  . BIOGAIA PROBIOTIC (BIOGAIA PROBIOTIC) LIQD Take by mouth daily at 8 pm.    . cetirizine (ZYRTEC) 10 MG tablet Take 10 mg by mouth daily.    . cycloSPORINE (RESTASIS) 0.05 % ophthalmic emulsion 1 drop 2 (two) times daily.    . DULoxetine HCl 40 MG CPEP Take 80 mg by mouth daily. 180 capsule 2  . ezetimibe (ZETIA) 10 MG tablet Take 10 mg by mouth daily.  2  . ezetimibe-simvastatin (VYTORIN) 10-10 MG tablet Take 1 tablet by mouth at bedtime.    . fenofibrate (TRICOR) 145 MG tablet Take 145 mg by mouth daily.    . Flaxseed, Linseed, (FLAXSEED OIL) 1000 MG CAPS Take by mouth.    . gabapentin (NEURONTIN) 300 MG capsule Take 1 capsule (300 mg total) by mouth at bedtime. 90 capsule 0  . hydrOXYzine (VISTARIL) 25 MG capsule Take 1 capsule (25 mg total) by mouth at bedtime. 30 capsule 2  . Multiple Vitamin (MULTIVITAMIN) tablet Take 1 tablet by mouth daily.    Marland Kitchen  Omega-3 Fatty Acids (FISH OIL) 1000 MG CPDR Take by mouth.    . pentosan polysulfate (ELMIRON) 100 MG capsule Take 100 mg by mouth 3 (three) times daily.    Marland Kitchen triamterene-hydrochlorothiazide (DYAZIDE) 37.5-25 MG capsule Take 1 capsule by mouth daily.     No current facility-administered medications for this visit.     Neurologic: Headache: Negative Seizure: Negative Paresthesias: Negative  Musculoskeletal: Strength & Muscle Tone: within normal limits Gait & Station: normal Patient leans: N/A  Psychiatric Specialty Exam: ROS  There were no vitals taken for this visit.There is no height or weight on file to calculate BMI.  General Appearance: Casual and Fairly Groomed  Eye Contact:  Good  Speech:  Clear and  Coherent  Volume:  Normal  Mood:  Euthymic  Affect:  Appropriate and Congruent  Thought Process:  Coherent and Goal Directed  Orientation:  Full (Time, Place, and Person)  Thought Content: Logical   Suicidal Thoughts:  No  Homicidal Thoughts:  No  Memory:  Immediate;   Good  Judgement:  Good  Insight:  Fair  Psychomotor Activity:  Normal  Concentration:  Concentration: Fair  Recall:  Good  Fund of Knowledge: Good  Language: Good  Akathisia:  Negative  Handed:  Right  AIMS (if indicated):  0  Assets:  Communication Skills Desire for Improvement Financial Resources/Insurance Housing Transportation Vocational/Educational  ADL's:  Intact  Cognition: WNL  Sleep:  6-8 hours    Treatment Plan Summary: Angel Bell is a 41 year old male with generalized anxiety disorder and hypochondriasis presents today for med management follow-up. His anxiety and mood symptoms are well controlled, and I believe he would benefit on some CBT-I skills building.  He will schedule follow-up with Angel Bell.  Will RTC in 4 months.  1. GAD (generalized anxiety disorder)    Continue Cymbalta 80 mg Continue gabapentin 300 mg nightly Vistaril 25-50 mg nightly; okay to increase to as much as 100 mg qhs if needed  Angel Dubin, MD 10/12/2016, 8:15 AM

## 2016-11-07 ENCOUNTER — Ambulatory Visit (INDEPENDENT_AMBULATORY_CARE_PROVIDER_SITE_OTHER): Payer: 59 | Admitting: Psychiatry

## 2016-11-07 DIAGNOSIS — F411 Generalized anxiety disorder: Secondary | ICD-10-CM

## 2016-11-13 NOTE — Progress Notes (Signed)
   THERAPIST PROGRESS NOTE  Session Time: 3:05-4:00  Participation Level:Active  Behavioral Response:CasualAlertMildlyAnxious  Type of Therapy: Individual Therapy  Treatment Goals addressed: Coping; developing self-care  Interventions:CBT and Solution Focused  Summary: Angel Bedrosian Milleris a 41 y.o.malewho presents with generalized anxiety disorder.   Suicidal/Homicidal:Nowithout intent/plan  Therapist Response: Pt. Continues to present with improved affect and lowered anxiety. Pt. Reports no significant work or personal changes. Pt. Discussed that his parents will be visiting in the next week from [place] and that he is looking forward to their visit and planning activities that they will enjoy while they are in town. Pt. Discussed typical day which includes going to work and coming home and spending time with his phone, television, electronic games. Significant time in session was spent discussing strategies for reducing screen time which would help to reduce anxiety and help with sleep concerns. Pt. Reports that he is functioning well at work, that he does get perturbed at times by other employees, but not to the point that it interferes with his ability to function at work. Pt. Discussed desire to get a dog because he thinks it would help him to become more active and provide a needed leisure activity and social outlet.  Plan: Return again in 4weeks.  Diagnosis:Axis I:Anxiety Disorder NOS  Axis II:No diagnosis    Angel Bell, [facility] 11/13/2016

## 2017-01-08 ENCOUNTER — Ambulatory Visit (HOSPITAL_COMMUNITY): Payer: 59 | Admitting: Psychiatry

## 2017-01-10 ENCOUNTER — Ambulatory Visit (INDEPENDENT_AMBULATORY_CARE_PROVIDER_SITE_OTHER): Payer: 59 | Admitting: Psychiatry

## 2017-01-10 DIAGNOSIS — F411 Generalized anxiety disorder: Secondary | ICD-10-CM | POA: Diagnosis not present
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
## 2017-02-01 ENCOUNTER — Ambulatory Visit (HOSPITAL_COMMUNITY): Payer: Commercial Managed Care - HMO | Admitting: Psychiatry

## 2017-02-14 ENCOUNTER — Ambulatory Visit (INDEPENDENT_AMBULATORY_CARE_PROVIDER_SITE_OTHER): Payer: 59 | Admitting: Psychiatry

## 2017-02-14 ENCOUNTER — Encounter (HOSPITAL_COMMUNITY): Payer: Self-pay | Admitting: Psychiatry

## 2017-02-14 VITALS — BP 122/86 | HR 104 | Ht 68.0 in | Wt 254.0 lb

## 2017-02-14 DIAGNOSIS — Z818 Family history of other mental and behavioral disorders: Secondary | ICD-10-CM | POA: Diagnosis not present

## 2017-02-14 DIAGNOSIS — F411 Generalized anxiety disorder: Secondary | ICD-10-CM

## 2017-02-14 DIAGNOSIS — Z87891 Personal history of nicotine dependence: Secondary | ICD-10-CM | POA: Diagnosis not present

## 2017-02-14 DIAGNOSIS — F422 Mixed obsessional thoughts and acts: Secondary | ICD-10-CM

## 2017-02-14 MED ORDER — DULOXETINE HCL 40 MG PO CPEP: 80.0000 mg | ORAL_CAPSULE | Freq: Every day | ORAL | 1 refills | Status: DC

## 2017-02-14 NOTE — Progress Notes (Signed)
[place] MD/PA/NP OP Progress Note  02/14/2017 3:32 PM Angel Bell  MRN:  [id number]  Chief Complaint: med management HPI: Angel Bell reports his mood has remained fairly stable, and he has been able to deal with some external stressors related to work.  He reports that his sleep is much better on Cymbalta 80 mg, and he rarely uses gabapentin or Vistaril.  He denies any safety issues or substance abuse.  He continues to work diligently every 4 weeks with [employer].  He feels like his obsessive tendencies are part of his personality, but they are better managed at the current dose of Cymbalta.  Patient was reflecting on interaction he had with a colleague in the context of some work stressors.  We processed some of his ability to be able to discern the obsessive tendencies of other people and ways that he has been able to introspect into his own rumination and obsessive tendencies.  He reports this has been enlightening for him.  Visit Diagnosis:    ICD-10-CM   1. Mixed obsessional thoughts and acts F42.2   2. GAD (generalized anxiety disorder) F41.1 DULoxetine HCl 40 MG CPEP    Past Psychiatric History: See intake H&P for full details. Reviewed, with no updates at this time.  Past Medical History:  Past Medical History:  Diagnosis Date  . Anxiety   . Depression     Past Surgical History:  Procedure Laterality Date  . APPENDECTOMY    . WISDOM TOOTH EXTRACTION      Family Psychiatric History: See intake H&P for full details. Reviewed, with no updates at this time.   Family History:  Family History  Problem Relation Age of Onset  . Depression Mother     Social History:  Social History   Socioeconomic History  . Marital status: Single    Spouse name: None  . Number of children: None  . Years of education: None  . Highest education level: None  Social Needs  . Financial resource strain: None  . Food insecurity - worry: None  . Food insecurity - inability: None  .  Transportation needs - medical: None  . Transportation needs - non-medical: None  Occupational History  . None  Tobacco Use  . Smoking status: Former Smoker    Packs/day: 0.50    Types: Cigarettes    Last attempt to quit: 01/16/2009    Years since quitting: 8.0  . Smokeless tobacco: Never Used  Substance and Sexual Activity  . Alcohol use: None  . Drug use: No  . Sexual activity: Not Currently  Other Topics Concern  . None  Social History Narrative  . None    Allergies: No Known Allergies  Metabolic Disorder Labs: No results found for: HGBA1C, MPG No results found for: PROLACTIN No results found for: CHOL, TRIG, HDL, CHOLHDL, VLDL, LDLCALC No results found for: TSH  Therapeutic Level Labs: No results found for: LITHIUM No results found for: VALPROATE No components found for:  CBMZ  Current Medications: Current Outpatient Medications  Medication Sig Dispense Refill  . atorvastatin (LIPITOR) 10 MG tablet Take 10 mg by mouth daily.  3  . BIOGAIA PROBIOTIC (BIOGAIA PROBIOTIC) LIQD Take by mouth daily at 8 pm.    . cetirizine (ZYRTEC) 10 MG tablet Take 10 mg by mouth daily.    . cycloSPORINE (RESTASIS) 0.05 % ophthalmic emulsion 1 drop 2 (two) times daily.    . DULoxetine HCl 40 MG CPEP Take 80 mg daily by mouth. [place]  capsule 1  . ezetimibe-simvastatin (VYTORIN) 10-10 MG tablet Take 1 tablet by mouth at bedtime.    . Flaxseed, Linseed, (FLAXSEED OIL) 1000 MG CAPS Take by mouth.    . gabapentin (NEURONTIN) 300 MG capsule Take 1 capsule (300 mg total) by mouth at bedtime. 90 capsule 1  . hydrOXYzine (VISTARIL) 25 MG capsule Take 1 capsule (25 mg total) by mouth at bedtime. 30 capsule 2  . Multiple Vitamin (MULTIVITAMIN) tablet Take 1 tablet by mouth daily.    . Omega-3 Fatty Acids (FISH OIL) 1000 MG CPDR Take by mouth.    . pentosan polysulfate (ELMIRON) 100 MG capsule Take 100 mg by mouth 3 (three) times daily.    Marland Kitchen triamterene-hydrochlorothiazide (DYAZIDE) 37.5-25 MG  capsule Take 1 capsule by mouth daily.    Marland Kitchen ezetimibe (ZETIA) 10 MG tablet Take 10 mg by mouth daily.  2  . fenofibrate (TRICOR) 145 MG tablet Take 145 mg by mouth daily.    . irbesartan (AVAPRO) 75 MG tablet Take 75 mg daily by mouth.  3   No current facility-administered medications for this visit.      Musculoskeletal: Strength & Muscle Tone: within normal limits Gait & Station: normal Patient leans: N/A  Psychiatric Specialty Exam: ROS  Blood pressure 122/86, pulse (!) 104, height 5\' 8"  (1.727 m), weight 254 lb (115.2 kg).Body mass index is 38.62 kg/m.  General Appearance: Casual and Fairly Groomed  Eye Contact:  Good  Speech:  Clear and Coherent  Volume:  Normal  Mood:  Anxious and Euthymic  Affect:  Congruent  Thought Process:  Goal Directed and Descriptions of Associations: Intact  Orientation:  Full (Time, Place, and Person)  Thought Content: Logical   Suicidal Thoughts:  No  Homicidal Thoughts:  No  Memory:  Immediate;   Fair  Judgement:  Fair  Insight:  Fair  Psychomotor Activity:  Normal  Concentration:  Concentration: Fair  Recall:  [employer] of Knowledge: Fair  Language: Good  Akathisia:  Negative  Handed:  Right  AIMS (if indicated): not done  Assets:  Communication Skills Desire for Improvement Financial Resources/Insurance Housing Leisure Time Physical Health Resilience Social Support Talents/Skills Transportation Vocational/Educational  ADL's:  Intact  Cognition: WNL  Sleep:  Good   Screenings:   Assessment and Plan: Angel Bell presents as stable on his current medication regimen for generalized anxiety and mixed obsessional thoughts.  He is sleeping better with Cymbalta 80 mg, but no acute safety issues, and continues to develop insight into his cognitive distortions.  We will follow-up in 3-4 months.  1. Mixed obsessional thoughts and acts   2. GAD (generalized anxiety disorder)     Status of current problems: stable  Labs  Ordered: No orders of the defined types were placed in this encounter.   Labs Reviewed: n/a  Collateral Obtained/Records Reviewed: n/a  Plan:  Continue Cymbalta 80 mg daily Gabapentin 300 mg for sleep as needed Return to clinic in 4 months  I spent 25 minutes with the patient in direct face-to-face clinical care.  Greater than 50% of this time was spent in counseling and coordination of care with the patient.    Aundra Dubin, MD 02/14/2017, 3:32 PM

## 2017-03-14 ENCOUNTER — Ambulatory Visit (INDEPENDENT_AMBULATORY_CARE_PROVIDER_SITE_OTHER): Payer: 59 | Admitting: Psychiatry

## 2017-03-14 DIAGNOSIS — F411 Generalized anxiety disorder: Secondary | ICD-10-CM

## 2017-03-14 NOTE — Progress Notes (Signed)
   THERAPIST PROGRESS NOTE    Session Time: 1:35-2:30  Participation Level:Active  Behavioral Response:CasualAlertEuthymic  Type of Therapy: Individual Therapy  Treatment Goals addressed: Coping; developing self-care  Interventions:CBT and Solution Focused  Summary: Angel P Milleris a 41y.o.malewho presents with generalized anxiety disorder.   Suicidal/Homicidal:Nowithout intent/plan  Therapist Response: Pt. Presents with euthymic mood. Pt. 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 Pt. Discussed that he "seems to have gotten into the spirit" this year" and significant time in session was spent discussing the significance of this change this year as a theme in prior session has been the importance of finding a home and roots. Pt. Discussed the possibility of having friends in his home, which he was unsure that he would do but was open to the idea. Pt. Generally seemed more happy and relaxed than in previous sessions. Pt. Discussed that his appetite was good, he was sleeping well, and was generally contented with things in his life.  Plan: Return again in 4weeks.  Diagnosis:Generalized Anxiety Disorder  Nancie Neas, [facility] 03/14/2017

## 2017-04-02 ENCOUNTER — Ambulatory Visit (HOSPITAL_COMMUNITY): Payer: 59 | Admitting: Psychiatry

## 2017-04-04 ENCOUNTER — Ambulatory Visit (INDEPENDENT_AMBULATORY_CARE_PROVIDER_SITE_OTHER): Payer: BLUE CROSS/BLUE SHIELD | Admitting: Psychiatry

## 2017-04-04 DIAGNOSIS — F411 Generalized anxiety disorder: Secondary | ICD-10-CM | POA: Diagnosis not present

## 2017-04-05 NOTE — Progress Notes (Signed)
   THERAPIST PROGRESS NOTE   Session Time:3:30-4:30  Participation Level:Active  Behavioral Response:CasualAlertEuthymic  Type of Therapy: Individual Therapy  Treatment Goals addressed: Coping; developing self-care  Interventions:CBT and Solution Focused  Summary: Angel P Milleris a 41y.o.malewho presents with generalized anxiety disorder.   Suicidal/Homicidal:Nowithout intent/plan  Therapist Response: Pt. Continues to present with euthymic mood. Pt. Reports that he had a good Christmas that was spent with a friend and [place] had dinner and went to a movie with her and drove home and worked for much of the rest of the holiday. Pt. Discussed plans to go to visit family in [place] in a few weeks. Pt. Discussed that vague plans to go to a movie with a friend, but hesitant to invite anyone to his home because of embarrassment about its condition. Pt. Was encouraged to invest in a cleaning service which he indicated that was would not do because he would be embarrassed to have them come. Session spent discussing concept of external motivation and need to use this by inviting other's to his home to motivate him to make progress in his home. Pt. Encouraged to make investments in his self-care ie, home care, massage, seeking friends. Pt. Discussed going to gay bingo and was encouraged to go alone and meet people if he could not find a co-worker to join him.  Plan: Return again in 4weeks. Continue with CBT based therapy.  Diagnosis:Generalized Anxiety Disorder  Nancie Neas, [facility] 04/05/2017

## 2017-04-06 DIAGNOSIS — I10 Essential (primary) hypertension: Secondary | ICD-10-CM | POA: Diagnosis not present

## 2017-04-11 DIAGNOSIS — N3010 Interstitial cystitis (chronic) without hematuria: Secondary | ICD-10-CM | POA: Diagnosis not present

## 2017-04-12 DIAGNOSIS — M4003 Postural kyphosis, cervicothoracic region: Secondary | ICD-10-CM | POA: Diagnosis not present

## 2017-04-12 DIAGNOSIS — M9901 Segmental and somatic dysfunction of cervical region: Secondary | ICD-10-CM | POA: Diagnosis not present

## 2017-04-12 DIAGNOSIS — M47814 Spondylosis without myelopathy or radiculopathy, thoracic region: Secondary | ICD-10-CM | POA: Diagnosis not present

## 2017-04-12 DIAGNOSIS — M9902 Segmental and somatic dysfunction of thoracic region: Secondary | ICD-10-CM | POA: Diagnosis not present

## 2017-04-18 ENCOUNTER — Telehealth (HOSPITAL_COMMUNITY): Payer: Self-pay

## 2017-04-18 ENCOUNTER — Other Ambulatory Visit (HOSPITAL_COMMUNITY): Payer: Self-pay | Admitting: Psychiatry

## 2017-04-18 DIAGNOSIS — F411 Generalized anxiety disorder: Secondary | ICD-10-CM

## 2017-04-18 MED ORDER — GABAPENTIN 300 MG PO CAPS: 300.0000 mg | ORAL_CAPSULE | Freq: Every day | ORAL | 1 refills | Status: DC

## 2017-04-18 NOTE — Telephone Encounter (Signed)
Ill send it now, thank you!

## 2017-04-18 NOTE — Telephone Encounter (Signed)
Medication refill request - Fax received from pt's [facility] pharmacy for a refill of prescribed Neurontin, last ordered 10/12/16 for 90 days + 1 refill. Pt. seen 02/14/17 and continued and does not return until 06/14/17.

## 2017-05-02 ENCOUNTER — Ambulatory Visit (INDEPENDENT_AMBULATORY_CARE_PROVIDER_SITE_OTHER): Payer: BLUE CROSS/BLUE SHIELD | Admitting: Psychiatry

## 2017-05-02 DIAGNOSIS — F411 Generalized anxiety disorder: Secondary | ICD-10-CM | POA: Diagnosis not present

## 2017-05-03 NOTE — Progress Notes (Signed)
   THERAPIST PROGRESS NOTE  Session Time:3:35-4:30  Participation Level:Active  Behavioral Response:CasualAlertEuthymic  Type of Therapy: Individual Therapy  Treatment Goals addressed: Coping; developing self-care  Interventions:CBT and Solution Focused  Summary: Angel P Milleris a 41y.o.malewho presents with generalized anxiety disorder.   Suicidal/Homicidal:Nowithout intent/plan  Therapist Response: Pt. Continues to present with euthymic mood. Pt. Recovering from sinus, upper respiratory infection. Pt. Reports that he had a good visit with his parents after Christmas in [place]. Pt. 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 Pt. Discussed perceived injustices to other employees and gratification from standing up for rights of his team that he feels are unfairly picked on. Counselor raised ongoing issue of self-care and ways that pt. Can begin to take care of himself. Pt. Discussed that he recently had his care washed and detailed that made him feel better about himself. Pt. Continues to complain about lack of disorganization in his home, but resists suggestion of having service to help him clean his home. Pt discusses conflict about wanting to have others in his home. Pt. Also discussed ongoing conflict about making Angel Bell his longterm home or making roots elsewhere. Pt. Discussed the difficulty of creating seniority with another company in a town that would give him the work life-life balance that he has now. Pt. Indicated that he is happy and his stress level is low which keeps him complacent.  Plan: Return again in 4weeks. Continue with CBT based therapy.  Diagnosis:Generalized Anxiety Disorder  Nancie Neas, [facility] 05/03/2017

## 2017-05-11 DIAGNOSIS — M9901 Segmental and somatic dysfunction of cervical region: Secondary | ICD-10-CM | POA: Diagnosis not present

## 2017-05-11 DIAGNOSIS — M4003 Postural kyphosis, cervicothoracic region: Secondary | ICD-10-CM | POA: Diagnosis not present

## 2017-05-11 DIAGNOSIS — M47814 Spondylosis without myelopathy or radiculopathy, thoracic region: Secondary | ICD-10-CM | POA: Diagnosis not present

## 2017-05-11 DIAGNOSIS — M9902 Segmental and somatic dysfunction of thoracic region: Secondary | ICD-10-CM | POA: Diagnosis not present

## 2017-05-18 DIAGNOSIS — E7800 Pure hypercholesterolemia, unspecified: Secondary | ICD-10-CM | POA: Diagnosis not present

## 2017-05-30 ENCOUNTER — Ambulatory Visit (INDEPENDENT_AMBULATORY_CARE_PROVIDER_SITE_OTHER): Payer: BLUE CROSS/BLUE SHIELD | Admitting: Psychiatry

## 2017-05-30 DIAGNOSIS — F411 Generalized anxiety disorder: Secondary | ICD-10-CM

## 2017-06-07 NOTE — Progress Notes (Signed)
   THERAPIST PROGRESS NOTE Session Time:3:35-4:30  Participation Level:Active  Behavioral Response:CasualAlertEuthymic  Type of Therapy: Individual Therapy  Treatment Goals addressed: Coping; developing self-care  Interventions:CBT and Solution Focused  Summary: Angel P Milleris a 41y.o.malewho presents with generalized anxiety disorder.   Suicidal/Homicidal:Nowithout intent/plan  Therapist Response:Pt. Presents with euthymic mood. Pt. Discussed that he is generally tired. Pt. Attributes his lack of physical and emotional energy to working very had at his job. Session focused on following up on social support and social interest. Pt. Generally has low interest in connection to others and does not see the need for social connection or regular support from friends or community in alleviating his anxiety. Pt. Has discussed connecting with community choir, but has not followed up on this. Pt. Has discussed preparing his home to have friends over, but has not followed up on this. Pt. Leads a fairly solitary life with few if any intimate friends or social connections, pets, or social interest/engagement or regular self-care and sees no problem with this or connection to improving his mental health. Pt. Is very connected to his job and this is an extension of his sense of self and creativity, but does little else not related to his work. Session focused on exploring avenues for social connection and self-care that might become a nature part of Pt.'s daily routine.  Plan: Return again in 4weeks.Continue with CBT based therapy.  Diagnosis:Generalized Anxiety Disorder     Nancie Neas, [facility] 06/07/2017

## 2017-06-08 DIAGNOSIS — M47814 Spondylosis without myelopathy or radiculopathy, thoracic region: Secondary | ICD-10-CM | POA: Diagnosis not present

## 2017-06-08 DIAGNOSIS — M9902 Segmental and somatic dysfunction of thoracic region: Secondary | ICD-10-CM | POA: Diagnosis not present

## 2017-06-08 DIAGNOSIS — M9901 Segmental and somatic dysfunction of cervical region: Secondary | ICD-10-CM | POA: Diagnosis not present

## 2017-06-08 DIAGNOSIS — M4003 Postural kyphosis, cervicothoracic region: Secondary | ICD-10-CM | POA: Diagnosis not present

## 2017-06-14 ENCOUNTER — Ambulatory Visit (HOSPITAL_COMMUNITY): Payer: BLUE CROSS/BLUE SHIELD | Admitting: Psychiatry

## 2017-06-14 ENCOUNTER — Encounter (HOSPITAL_COMMUNITY): Payer: Self-pay | Admitting: Psychiatry

## 2017-06-14 VITALS — BP 128/82 | HR 72 | Ht 68.5 in | Wt 258.0 lb

## 2017-06-14 DIAGNOSIS — F422 Mixed obsessional thoughts and acts: Secondary | ICD-10-CM

## 2017-06-14 DIAGNOSIS — Z87891 Personal history of nicotine dependence: Secondary | ICD-10-CM | POA: Diagnosis not present

## 2017-06-14 DIAGNOSIS — F1099 Alcohol use, unspecified with unspecified alcohol-induced disorder: Secondary | ICD-10-CM | POA: Diagnosis not present

## 2017-06-14 DIAGNOSIS — Z818 Family history of other mental and behavioral disorders: Secondary | ICD-10-CM | POA: Diagnosis not present

## 2017-06-14 DIAGNOSIS — F411 Generalized anxiety disorder: Secondary | ICD-10-CM | POA: Diagnosis not present

## 2017-06-14 MED ORDER — DULOXETINE HCL 40 MG PO CPEP: 80.0000 mg | ORAL_CAPSULE | Freq: Every day | ORAL | 1 refills | Status: DC

## 2017-06-14 MED ORDER — GABAPENTIN 300 MG PO CAPS: 300.0000 mg | ORAL_CAPSULE | Freq: Every day | ORAL | 1 refills | Status: DC

## 2017-06-14 NOTE — Progress Notes (Signed)
[place] MD/PA/NP OP Progress Note  06/14/2017 8:44 AM Angel Bell  MRN:  [id number]  Chief Complaint: okay  HPI: Angel Bell presents for follow-up.  Reports things are stable at work.  Denies any significant changes in his social setting.  He is sleeping well.  No side effects from Cymbalta.  Spent time discussing ways that he can potentially come out of his comfort zone and increase his socialization.  He is not particularly interested in this at this time  Visit Diagnosis:    ICD-10-CM   1. Mixed obsessional thoughts and acts F42.2   2. GAD (generalized anxiety disorder) F41.1 DULoxetine HCl 40 MG CPEP    gabapentin (NEURONTIN) 300 MG capsule   Past Psychiatric History: See intake H&P for full details. Reviewed, with no updates at this time.  Past Medical History:  Past Medical History:  Diagnosis Date  . Anxiety   . Depression     Past Surgical History:  Procedure Laterality Date  . APPENDECTOMY    . WISDOM TOOTH EXTRACTION     Family Psychiatric History: See intake H&P for full details. Reviewed, with no updates at this time.  Family History:  Family History  Problem Relation Age of Onset  . Depression Mother    Social History:  Social History   Socioeconomic History  . Marital status: Single    Spouse name: None  . Number of children: None  . Years of education: None  . Highest education level: None  Social Needs  . Financial resource strain: None  . Food insecurity - worry: None  . Food insecurity - inability: None  . Transportation needs - medical: None  . Transportation needs - non-medical: None  Occupational History  . None  Tobacco Use  . Smoking status: Former Smoker    Packs/day: 0.50    Types: Cigarettes    Last attempt to quit: 01/16/2009    Years since quitting: 8.4  . Smokeless tobacco: Never Used  Substance and Sexual Activity  . Alcohol use: Yes    Alcohol/week: 3.0 oz    Types: 5 Shots of liquor per week  . Drug use: No  . Sexual  activity: Not Currently  Other Topics Concern  . None  Social History Narrative  . None    Allergies: No Known Allergies  Metabolic Disorder Labs: No results found for: HGBA1C, MPG No results found for: PROLACTIN No results found for: CHOL, TRIG, HDL, CHOLHDL, VLDL, LDLCALC No results found for: TSH  Therapeutic Level Labs: No results found for: LITHIUM No results found for: VALPROATE No components found for:  CBMZ  Current Medications: Current Outpatient Medications  Medication Sig Dispense Refill  . alclomethasone (ACLOVATE) 0.05 % cream APPLY TO AFFECTED AREA EVERY DAY AS NEEDED  0  . atorvastatin (LIPITOR) 20 MG tablet Take 20 mg by mouth daily.  3  . BIOGAIA PROBIOTIC (BIOGAIA PROBIOTIC) LIQD Take by mouth daily at 8 pm.    . cetirizine (ZYRTEC) 10 MG tablet Take 10 mg by mouth daily.    . cycloSPORINE (RESTASIS) 0.05 % ophthalmic emulsion 1 drop 2 (two) times daily.    . DULoxetine HCl 40 MG CPEP Take 80 mg by mouth daily. 180 capsule 1  . Flaxseed, Linseed, (FLAXSEED OIL) 1000 MG CAPS Take by mouth.    . gabapentin (NEURONTIN) 300 MG capsule Take 1 capsule (300 mg total) by mouth at bedtime. 90 capsule 1  . hydrOXYzine (VISTARIL) 25 MG capsule Take 1 capsule (25 mg  total) by mouth at bedtime. 30 capsule 2  . irbesartan (AVAPRO) 75 MG tablet Take 75 mg daily by mouth.  3  . Multiple Vitamin (MULTIVITAMIN) tablet Take 1 tablet by mouth daily.    . Omega-3 Fatty Acids (FISH OIL) 1000 MG CPDR Take by mouth.    . pentosan polysulfate (ELMIRON) 100 MG capsule Take 100 mg by mouth 3 (three) times daily.    Marland Kitchen triamterene-hydrochlorothiazide (DYAZIDE) 37.5-25 MG capsule Take 1 capsule by mouth daily.    Marland Kitchen ezetimibe-simvastatin (VYTORIN) 10-10 MG tablet Take 1 tablet by mouth at bedtime.    . triamterene-hydrochlorothiazide (MAXZIDE-25) 37.5-25 MG tablet TAKE 1 TABLET BY MOUTH EVERY DAY IN THE MORNING  1   No current facility-administered medications for this visit.       Musculoskeletal: Strength & Muscle Tone: within normal limits Gait & Station: normal Patient leans: N/A  Psychiatric Specialty Exam: ROS  Blood pressure 128/82, pulse 72, height 5' 8.5" (1.74 m), weight 258 lb (117 kg), SpO2 95 %.Body mass index is 38.66 kg/m.  General Appearance: Casual and Fairly Groomed  Eye Contact:  Good  Speech:  Clear and Coherent and Normal Rate  Volume:  Normal  Mood:  Euthymic  Affect:  Appropriate and Congruent  Thought Process:  Coherent, Goal Directed and Descriptions of Associations: Intact  Orientation:  Full (Time, Place, and Person)  Thought Content: Logical   Suicidal Thoughts:  No  Homicidal Thoughts:  No  Memory:  Immediate;   Fair  Judgement:  Fair  Insight:  Present and Shallow  Psychomotor Activity:  Normal  Concentration:  Concentration: Good  Recall:  Good  Fund of Knowledge: Good  Language: Good  Akathisia:  Negative  Handed:  Right  AIMS (if indicated): not done  Assets:  Industrial/product designer  ADL's:  Intact  Cognition: WNL  Sleep:  Good   Screenings:   Assessment and Plan:  Angel Bell presents with general stability of his symptoms. No acute issues and will follow up in 3 months.  1. Mixed obsessional thoughts and acts   2. GAD (generalized anxiety disorder)    Status of current problems: stable  Labs Ordered: No orders of the defined types were placed in this encounter.   Labs Reviewed: n/a  Collateral Obtained/Records Reviewed: n/a  Plan:  Continue to work with therapist to hopefully engage in progressing forward in socialization, increasing activities and getting out of his comfort zone Continue cymbalta, gagbapentin, vistaril  I spent 15 minutes with the patient in direct face-to-face clinical care.  Greater than 50% of this time was spent in counseling and coordination of care with the patient.    Aundra Dubin, MD 06/14/2017,  8:44 AM

## 2017-06-27 ENCOUNTER — Ambulatory Visit (HOSPITAL_COMMUNITY): Payer: BLUE CROSS/BLUE SHIELD | Admitting: Psychiatry

## 2017-07-06 DIAGNOSIS — M9902 Segmental and somatic dysfunction of thoracic region: Secondary | ICD-10-CM | POA: Diagnosis not present

## 2017-07-06 DIAGNOSIS — M4003 Postural kyphosis, cervicothoracic region: Secondary | ICD-10-CM | POA: Diagnosis not present

## 2017-07-06 DIAGNOSIS — M47814 Spondylosis without myelopathy or radiculopathy, thoracic region: Secondary | ICD-10-CM | POA: Diagnosis not present

## 2017-07-06 DIAGNOSIS — M9901 Segmental and somatic dysfunction of cervical region: Secondary | ICD-10-CM | POA: Diagnosis not present

## 2017-08-03 DIAGNOSIS — M47814 Spondylosis without myelopathy or radiculopathy, thoracic region: Secondary | ICD-10-CM | POA: Diagnosis not present

## 2017-08-03 DIAGNOSIS — M9902 Segmental and somatic dysfunction of thoracic region: Secondary | ICD-10-CM | POA: Diagnosis not present

## 2017-08-03 DIAGNOSIS — M4003 Postural kyphosis, cervicothoracic region: Secondary | ICD-10-CM | POA: Diagnosis not present

## 2017-08-03 DIAGNOSIS — M9901 Segmental and somatic dysfunction of cervical region: Secondary | ICD-10-CM | POA: Diagnosis not present

## 2017-08-31 DIAGNOSIS — M9901 Segmental and somatic dysfunction of cervical region: Secondary | ICD-10-CM | POA: Diagnosis not present

## 2017-08-31 DIAGNOSIS — M9902 Segmental and somatic dysfunction of thoracic region: Secondary | ICD-10-CM | POA: Diagnosis not present

## 2017-08-31 DIAGNOSIS — M47814 Spondylosis without myelopathy or radiculopathy, thoracic region: Secondary | ICD-10-CM | POA: Diagnosis not present

## 2017-08-31 DIAGNOSIS — M4003 Postural kyphosis, cervicothoracic region: Secondary | ICD-10-CM | POA: Diagnosis not present

## 2017-09-10 ENCOUNTER — Ambulatory Visit (INDEPENDENT_AMBULATORY_CARE_PROVIDER_SITE_OTHER): Payer: BLUE CROSS/BLUE SHIELD | Admitting: Psychiatry

## 2017-09-10 DIAGNOSIS — F411 Generalized anxiety disorder: Secondary | ICD-10-CM | POA: Diagnosis not present

## 2017-09-14 NOTE — Progress Notes (Signed)
   THERAPIST PROGRESS NOTE  Session Time:3:05-4:00  Participation Level:Active  Behavioral Response:CasualAlertEuthymic  Type of Therapy: Individual Therapy  Treatment Goals addressed: Coping; developing self-care  Interventions:CBT and Solution Focused  Summary: Angel Varnell Milleris a 42y.o.malewho presents with generalized anxiety disorder.   Suicidal/Homicidal:Nowithout intent/plan  Therapist Response:Pt. continues with with euthymic mood. Pt. Continues to report that he is generally tired, complained of back problems that he attributed to physical part of his work. Pt. Does not report that he has made significant progress with socialization or engaging with community of friend groups. Pt. Continues to be highly motivated by his job, committed to his work. Pt. Discussed that he is sleeping well and that his anxiety is well managed with his medication.  Plan: Return again in 4weeks.Continue with CBT based therapy.  Diagnosis:Generalized Anxiety Disorder    Nancie Neas, [facility] 09/14/2017

## 2017-09-17 ENCOUNTER — Ambulatory Visit (INDEPENDENT_AMBULATORY_CARE_PROVIDER_SITE_OTHER): Payer: BLUE CROSS/BLUE SHIELD | Admitting: Psychiatry

## 2017-09-17 ENCOUNTER — Encounter (HOSPITAL_COMMUNITY): Payer: Self-pay | Admitting: Psychiatry

## 2017-09-17 VITALS — BP 132/80 | HR 88 | Ht 68.0 in | Wt 250.0 lb

## 2017-09-17 DIAGNOSIS — M9902 Segmental and somatic dysfunction of thoracic region: Secondary | ICD-10-CM | POA: Diagnosis not present

## 2017-09-17 DIAGNOSIS — M9901 Segmental and somatic dysfunction of cervical region: Secondary | ICD-10-CM | POA: Diagnosis not present

## 2017-09-17 DIAGNOSIS — F411 Generalized anxiety disorder: Secondary | ICD-10-CM

## 2017-09-17 DIAGNOSIS — F422 Mixed obsessional thoughts and acts: Secondary | ICD-10-CM

## 2017-09-17 DIAGNOSIS — M47814 Spondylosis without myelopathy or radiculopathy, thoracic region: Secondary | ICD-10-CM | POA: Diagnosis not present

## 2017-09-17 DIAGNOSIS — M4003 Postural kyphosis, cervicothoracic region: Secondary | ICD-10-CM | POA: Diagnosis not present

## 2017-09-17 MED ORDER — DULOXETINE HCL 60 MG PO CPEP: 120.0000 mg | ORAL_CAPSULE | Freq: Every day | ORAL | 0 refills | Status: DC

## 2017-09-17 MED ORDER — GABAPENTIN 300 MG PO CAPS: 300.0000 mg | ORAL_CAPSULE | Freq: Every day | ORAL | 1 refills | Status: DC

## 2017-09-17 NOTE — Progress Notes (Signed)
[place] MD/PA/NP OP Progress Note  09/17/2017 10:50 AM Angel Bell  MRN:  [id number]  Chief Complaint: I am a little better this week, had a tough week last week  HPI: Angel Bell reports that things are going okay, he had a little bit of a tough week last week because his back is been hurting him more.  He admits that he does not do all the physical therapy exercises that he is supposed to do, but spent time over the weekend working on this and it seemed to help.  He reports that his colleagues were also out of the office last week and he tends to be a bit more dysphoric when he is alone at work.  We discussed increasing Cymbalta to 120 mg to better target some of his back pain and breakthrough depressive symptoms.  He was agreeable to this change and follow-up in 3 months.  Disclosed to patient that this Probation officer is leaving this practice at the end of August 2019, and patients always has the right to choose their provider. Reassured patient that office will work to provide smooth transition of care whether they wish to remain at this office, or to continue with this provider, or seek alternative care options in community.  They expressed understanding.  He made a comment that his back was hurting him so much last week he could have just died.  Reviewed with the patient and he confirms that he has no intention of hurting himself and has no suicidal thoughts.  Visit Diagnosis:    ICD-10-CM   1. Mixed obsessional thoughts and acts F42.2 DULoxetine (CYMBALTA) 60 MG capsule  2. GAD (generalized anxiety disorder) F41.1 gabapentin (NEURONTIN) 300 MG capsule    DULoxetine (CYMBALTA) 60 MG capsule     Past Psychiatric History: See intake H&P for full details. Reviewed, with no updates at this time.  Past Medical History:  Past Medical History:  Diagnosis Date  . Anxiety   . Depression     Past Surgical History:  Procedure Laterality Date  . APPENDECTOMY    . WISDOM TOOTH EXTRACTION      Family Psychiatric History: See intake H&P for full details. Reviewed, with no updates at this time.  Family History:  Family History  Problem Relation Age of Onset  . Depression Mother    Social History:  Social History   Socioeconomic History  . Marital status: Single    Spouse name: Not on file  . Number of children: Not on file  . Years of education: Not on file  . Highest education level: Not on file  Occupational History  . Not on file  Social Needs  . Financial resource strain: Not on file  . Food insecurity:    Worry: Not on file    Inability: Not on file  . Transportation needs:    Medical: Not on file    Non-medical: Not on file  Tobacco Use  . Smoking status: Former Smoker    Packs/day: 0.50    Types: Cigarettes    Last attempt to quit: 01/16/2009    Years since quitting: 8.6  . Smokeless tobacco: Never Used  Substance and Sexual Activity  . Alcohol use: Yes    Alcohol/week: 3.0 oz    Types: 5 Shots of liquor per week  . Drug use: No  . Sexual activity: Not Currently  Lifestyle  . Physical activity:    Days per week: Not on file    Minutes per session:  Not on file  . Stress: Not on file  Relationships  . Social connections:    Talks on phone: Not on file    Gets together: Not on file    Attends religious service: Not on file    Active member of club or organization: Not on file    Attends meetings of clubs or organizations: Not on file    Relationship status: Not on file  Other Topics Concern  . Not on file  Social History Narrative  . Not on file    Allergies: No Known Allergies  Metabolic Disorder Labs: No results found for: HGBA1C, MPG No results found for: PROLACTIN No results found for: CHOL, TRIG, HDL, CHOLHDL, VLDL, LDLCALC No results found for: TSH  Therapeutic Level Labs: No results found for: LITHIUM No results found for: VALPROATE No components found for:  CBMZ  Current Medications: Current Outpatient Medications   Medication Sig Dispense Refill  . alclomethasone (ACLOVATE) 0.05 % cream APPLY TO AFFECTED AREA EVERY DAY AS NEEDED  0  . atorvastatin (LIPITOR) 20 MG tablet Take 20 mg by mouth daily.  3  . BIOGAIA PROBIOTIC (BIOGAIA PROBIOTIC) LIQD Take by mouth daily at 8 pm.    . cetirizine (ZYRTEC) 10 MG tablet Take 10 mg by mouth daily.    . cycloSPORINE (RESTASIS) 0.05 % ophthalmic emulsion 1 drop 2 (two) times daily.    Marland Kitchen ezetimibe-simvastatin (VYTORIN) 10-10 MG tablet Take 1 tablet by mouth at bedtime.    . Flaxseed, Linseed, (FLAXSEED OIL) 1000 MG CAPS Take by mouth.    . gabapentin (NEURONTIN) 300 MG capsule Take 1 capsule (300 mg total) by mouth at bedtime. 90 capsule 1  . irbesartan (AVAPRO) 75 MG tablet Take 75 mg daily by mouth.  3  . Multiple Vitamin (MULTIVITAMIN) tablet Take 1 tablet by mouth daily.    . Omega-3 Fatty Acids (FISH OIL) 1000 MG CPDR Take by mouth.    . pentosan polysulfate (ELMIRON) 100 MG capsule Take 100 mg by mouth 3 (three) times daily.    Marland Kitchen triamterene-hydrochlorothiazide (DYAZIDE) 37.5-25 MG capsule Take 1 capsule by mouth daily.    Marland Kitchen triamterene-hydrochlorothiazide (MAXZIDE-25) 37.5-25 MG tablet TAKE 1 TABLET BY MOUTH EVERY DAY IN THE MORNING  1  . DULoxetine (CYMBALTA) 60 MG capsule Take 2 capsules (120 mg total) by mouth daily. 180 capsule 0   No current facility-administered medications for this visit.      Musculoskeletal: Strength & Muscle Tone: within normal limits Gait & Station: normal Patient leans: N/A  Psychiatric Specialty Exam: ROS  Blood pressure 132/80, pulse 88, height 5\' 8"  (1.727 m), weight 250 lb (113.4 kg).Body mass index is 38.01 kg/m.  General Appearance: Casual and Fairly Groomed  Eye Contact:  Good  Speech:  Clear and Coherent and Normal Rate  Volume:  Normal  Mood:  Dysphoric  Affect:  Congruent and Depressed  Thought Process:  Coherent, Goal Directed and Descriptions of Associations: Intact  Orientation:  Full (Time, Place, and  Person)  Thought Content: Logical   Suicidal Thoughts:  No  Homicidal Thoughts:  No  Memory:  Immediate;   Fair  Judgement:  Fair  Insight:  Present  Psychomotor Activity:  Normal  Concentration:  Concentration: Good  Recall:  Good  Fund of Knowledge: Good  Language: Good  Akathisia:  Negative  Handed:  Right  AIMS (if indicated): not done  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  We agreed to increase Cymbalta as below and we will follow-up in 3 months.  1. Mixed obsessional thoughts and acts   2. GAD (generalized anxiety disorder)    Status of current problems: stable  Labs Ordered: No orders of the defined types were placed in this encounter.  Labs Reviewed: n/a  Collateral Obtained/Records Reviewed: n/a  Plan:  Increase Cymbalta to 120 mg Continue gagbapentin Patient did not use Vistaril  Aundra Dubin, MD 09/17/2017, 10:50 AM

## 2017-09-28 DIAGNOSIS — M47814 Spondylosis without myelopathy or radiculopathy, thoracic region: Secondary | ICD-10-CM | POA: Diagnosis not present

## 2017-09-28 DIAGNOSIS — M9901 Segmental and somatic dysfunction of cervical region: Secondary | ICD-10-CM | POA: Diagnosis not present

## 2017-09-28 DIAGNOSIS — M4003 Postural kyphosis, cervicothoracic region: Secondary | ICD-10-CM | POA: Diagnosis not present

## 2017-09-28 DIAGNOSIS — M9902 Segmental and somatic dysfunction of thoracic region: Secondary | ICD-10-CM | POA: Diagnosis not present

## 2017-10-10 ENCOUNTER — Ambulatory Visit (HOSPITAL_COMMUNITY): Payer: Self-pay | Admitting: Psychiatry

## 2017-10-12 DIAGNOSIS — M9902 Segmental and somatic dysfunction of thoracic region: Secondary | ICD-10-CM | POA: Diagnosis not present

## 2017-10-12 DIAGNOSIS — M4003 Postural kyphosis, cervicothoracic region: Secondary | ICD-10-CM | POA: Diagnosis not present

## 2017-10-12 DIAGNOSIS — M47814 Spondylosis without myelopathy or radiculopathy, thoracic region: Secondary | ICD-10-CM | POA: Diagnosis not present

## 2017-10-12 DIAGNOSIS — M9901 Segmental and somatic dysfunction of cervical region: Secondary | ICD-10-CM | POA: Diagnosis not present

## 2017-11-02 DIAGNOSIS — M9901 Segmental and somatic dysfunction of cervical region: Secondary | ICD-10-CM | POA: Diagnosis not present

## 2017-11-02 DIAGNOSIS — M47814 Spondylosis without myelopathy or radiculopathy, thoracic region: Secondary | ICD-10-CM | POA: Diagnosis not present

## 2017-11-02 DIAGNOSIS — M9902 Segmental and somatic dysfunction of thoracic region: Secondary | ICD-10-CM | POA: Diagnosis not present

## 2017-11-02 DIAGNOSIS — M4003 Postural kyphosis, cervicothoracic region: Secondary | ICD-10-CM | POA: Diagnosis not present

## 2017-11-06 ENCOUNTER — Ambulatory Visit (HOSPITAL_COMMUNITY): Payer: Self-pay | Admitting: Psychiatry

## 2017-11-08 ENCOUNTER — Ambulatory Visit (HOSPITAL_COMMUNITY): Payer: Self-pay | Admitting: Psychiatry

## 2017-11-15 ENCOUNTER — Encounter (HOSPITAL_COMMUNITY): Payer: Self-pay | Admitting: Psychiatry

## 2017-11-15 ENCOUNTER — Ambulatory Visit (INDEPENDENT_AMBULATORY_CARE_PROVIDER_SITE_OTHER): Payer: BLUE CROSS/BLUE SHIELD | Admitting: Psychiatry

## 2017-11-15 DIAGNOSIS — Z818 Family history of other mental and behavioral disorders: Secondary | ICD-10-CM

## 2017-11-15 DIAGNOSIS — F411 Generalized anxiety disorder: Secondary | ICD-10-CM | POA: Diagnosis not present

## 2017-11-15 DIAGNOSIS — F422 Mixed obsessional thoughts and acts: Secondary | ICD-10-CM

## 2017-11-15 DIAGNOSIS — M5431 Sciatica, right side: Secondary | ICD-10-CM | POA: Diagnosis not present

## 2017-11-15 MED ORDER — DULOXETINE HCL 60 MG PO CPEP: 120.0000 mg | ORAL_CAPSULE | Freq: Every day | ORAL | 0 refills | Status: DC

## 2017-11-15 MED ORDER — GABAPENTIN 300 MG PO CAPS: 600.0000 mg | ORAL_CAPSULE | Freq: Every day | ORAL | 1 refills | Status: AC

## 2017-11-15 NOTE — Progress Notes (Signed)
[place] MD/PA/NP OP Progress Note  11/15/2017 2:26 PM Angel Bell  MRN:  [id number]  Chief Complaint: pretty good  HPI: Angel Bell reports overall mood and anxiety stability.  Denies any acute safety issues.  Has had some ongoing sciatic nerve pain and is agreeable to referral to orthopedic surgery to consider injections if needed.  We also agreed to increase gabapentin to 2-3 capsules nightly.  We will follow-up in 2-3 months or sooner if needed.  He has a positive event coming up, he will be traveling to [place] for 2 weeks on a vacation and is excited for this upcoming trip  Visit Diagnosis:    ICD-10-CM   1. Sciatica of right side M54.31 Ambulatory referral to Orthopedic Surgery  2. GAD (generalized anxiety disorder) F41.1 gabapentin (NEURONTIN) 300 MG capsule    DULoxetine (CYMBALTA) 60 MG capsule  3. Mixed obsessional thoughts and acts F42.2 DULoxetine (CYMBALTA) 60 MG capsule     Past Psychiatric History: See intake H&P for full details. Reviewed, with no updates at this time.  Past Medical History:  Past Medical History:  Diagnosis Date  . Anxiety   . Depression     Past Surgical History:  Procedure Laterality Date  . APPENDECTOMY    . WISDOM TOOTH EXTRACTION     Family Psychiatric History: See intake H&P for full details. Reviewed, with no updates at this time.  Family History:  Family History  Problem Relation Age of Onset  . Depression Mother    Social History:  Social History   Socioeconomic History  . Marital status: Single    Spouse name: Not on file  . Number of children: Not on file  . Years of education: Not on file  . Highest education level: Not on file  Occupational History  . Not on file  Social Needs  . Financial resource strain: Not on file  . Food insecurity:    Worry: Not on file    Inability: Not on file  . Transportation needs:    Medical: Not on file    Non-medical: Not on file  Tobacco Use  . Smoking status: Former Smoker   Packs/day: 0.50    Types: Cigarettes    Last attempt to quit: 01/16/2009    Years since quitting: 8.8  . Smokeless tobacco: Never Used  Substance and Sexual Activity  . Alcohol use: Yes    Alcohol/week: 5.0 standard drinks    Types: 5 Shots of liquor per week  . Drug use: No  . Sexual activity: Not Currently  Lifestyle  . Physical activity:    Days per week: Not on file    Minutes per session: Not on file  . Stress: Not on file  Relationships  . Social connections:    Talks on phone: Not on file    Gets together: Not on file    Attends religious service: Not on file    Active member of club or organization: Not on file    Attends meetings of clubs or organizations: Not on file    Relationship status: Not on file  Other Topics Concern  . Not on file  Social History Narrative  . Not on file    Allergies: No Known Allergies  Metabolic Disorder Labs: No results found for: HGBA1C, MPG No results found for: PROLACTIN No results found for: CHOL, TRIG, HDL, CHOLHDL, VLDL, LDLCALC No results found for: TSH  Therapeutic Level Labs: No results found for: LITHIUM No results found for: VALPROATE  No components found for:  CBMZ  Current Medications: Current Outpatient Medications  Medication Sig Dispense Refill  . alclomethasone (ACLOVATE) 0.05 % cream APPLY TO AFFECTED AREA EVERY DAY AS NEEDED  0  . atorvastatin (LIPITOR) 20 MG tablet Take 20 mg by mouth daily.  3  . BIOGAIA PROBIOTIC (BIOGAIA PROBIOTIC) LIQD Take by mouth daily at 8 pm.    . cetirizine (ZYRTEC) 10 MG tablet Take 10 mg by mouth daily.    . cycloSPORINE (RESTASIS) 0.05 % ophthalmic emulsion 1 drop 2 (two) times daily.    . DULoxetine (CYMBALTA) 60 MG capsule Take 2 capsules (120 mg total) by mouth daily. 180 capsule 0  . ezetimibe-simvastatin (VYTORIN) 10-10 MG tablet Take 1 tablet by mouth at bedtime.    . Flaxseed, Linseed, (FLAXSEED OIL) 1000 MG CAPS Take by mouth.    . gabapentin (NEURONTIN) 300 MG capsule  Take 2 capsules (600 mg total) by mouth at bedtime. 180 capsule 1  . irbesartan (AVAPRO) 75 MG tablet Take 75 mg daily by mouth.  3  . Multiple Vitamin (MULTIVITAMIN) tablet Take 1 tablet by mouth daily.    . Omega-3 Fatty Acids (FISH OIL) 1000 MG CPDR Take by mouth.    . pentosan polysulfate (ELMIRON) 100 MG capsule Take 100 mg by mouth 3 (three) times daily.    Marland Kitchen triamterene-hydrochlorothiazide (DYAZIDE) 37.5-25 MG capsule Take 1 capsule by mouth daily.    Marland Kitchen triamterene-hydrochlorothiazide (MAXZIDE-25) 37.5-25 MG tablet TAKE 1 TABLET BY MOUTH EVERY DAY IN THE MORNING  1   No current facility-administered medications for this visit.      Musculoskeletal: Strength & Muscle Tone: within normal limits Gait & Station: normal Patient leans: N/A  Psychiatric Specialty Exam: ROS  There were no vitals taken for this visit.There is no height or weight on file to calculate BMI.  General Appearance: Casual and Fairly Groomed  Eye Contact:  Good  Speech:  Clear and Coherent and Normal Rate  Volume:  Normal  Mood:  Euthymic  Affect:  Congruent  Thought Process:  Coherent, Goal Directed and Descriptions of Associations: Intact  Orientation:  Full (Time, Place, and Person)  Thought Content: Logical   Suicidal Thoughts:  No  Homicidal Thoughts:  No  Memory:  Immediate;   Fair  Judgement:  Fair  Insight:  Present  Psychomotor Activity:  Normal  Concentration:  Concentration: Good  Recall:  Good  Fund of Knowledge: Good  Language: Good  Akathisia:  Negative  Handed:  Right  AIMS (if indicated): not done  Assets:  Agricultural consultant Housing Leisure Time Transportation Vocational/Educational  ADL's:  Intact  Cognition: WNL  Sleep:  Good   Screenings:  Assessment and Plan:  Angel Bell presents with significant improvement in overall mood and anxiety stability on Cymbalta 120 mg.  We agreed to increase gabapentin for chronic back/sciatic pain,  and placed a referral for orthopedic surgery for ongoing management and consideration of injections if needed.  No acute safety concerns and we will follow-up in 2-3 months.  1. Sciatica of right side   2. GAD (generalized anxiety disorder)   3. Mixed obsessional thoughts and acts    Status of current problems: stable  Labs Ordered: Orders Placed This Encounter  Procedures  . Ambulatory referral to Orthopedic Surgery    Referral Priority:   Routine    Referral Type:   Surgical    Referral Reason:   Specialty Services Required    Referred to Provider:  Garald Balding, MD    Requested Specialty:   Orthopedic Surgery    Number of Visits Requested:   1   Labs Reviewed: n/a  Collateral Obtained/Records Reviewed: n/a  Plan:  Cymbalta to 120 mg Increase Gabapentin to 600 nightly, okay to increase to 900 mg if needed RTC 2-3 months  Aundra Dubin, MD 11/15/2017, 2:26 PM

## 2017-11-20 ENCOUNTER — Ambulatory Visit (INDEPENDENT_AMBULATORY_CARE_PROVIDER_SITE_OTHER): Payer: Self-pay

## 2017-11-20 ENCOUNTER — Encounter (INDEPENDENT_AMBULATORY_CARE_PROVIDER_SITE_OTHER): Payer: Self-pay | Admitting: Orthopaedic Surgery

## 2017-11-20 ENCOUNTER — Ambulatory Visit (INDEPENDENT_AMBULATORY_CARE_PROVIDER_SITE_OTHER): Payer: BLUE CROSS/BLUE SHIELD | Admitting: Orthopaedic Surgery

## 2017-11-20 VITALS — BP 124/87 | HR 88 | Ht 68.0 in | Wt 250.0 lb

## 2017-11-20 DIAGNOSIS — M5441 Lumbago with sciatica, right side: Secondary | ICD-10-CM | POA: Diagnosis not present

## 2017-11-20 DIAGNOSIS — M25551 Pain in right hip: Secondary | ICD-10-CM | POA: Diagnosis not present

## 2017-11-20 DIAGNOSIS — G8929 Other chronic pain: Secondary | ICD-10-CM | POA: Diagnosis not present

## 2017-11-20 DIAGNOSIS — M898X5 Other specified disorders of bone, thigh: Secondary | ICD-10-CM

## 2017-11-20 NOTE — Progress Notes (Signed)
Office Visit Note   Patient: Angel Bell           Date of Birth: Mar 22, 1976           MRN: [id number] Visit Date: 11/20/2017              Requested by: Aundra Dubin, MD [place], [place] [address] PCP: Lujean Amel, MD   Assessment & Plan: Visit Diagnoses:  1. Chronic midline low back pain with right-sided sciatica   2. Exostosis of right femur   3. Pain in right hip     Plan:  #1: At this time is going to continue with his exercise program that has been taught to him.   #2: Also will continue to see his chiropractors as necessary. #3: At this time there is no plan for any treatment of the exostosis of the right femur   Follow-Up Instructions: No follow-ups on file.   Face-to-face time spent with patient was greater than 30 minutes.  Greater than 50% of the time was spent in counseling and coordination of care.  Orders:  Orders Placed This Encounter  Procedures  . XR HIP UNILAT W OR W/O PELVIS 2-3 VIEWS RIGHT  . XR Lumbar Spine 2-3 Views   No orders of the defined types were placed in this encounter.     Procedures: No procedures performed   Clinical Data: No additional findings.   Subjective: Chief Complaint  Patient presents with  . New Patient (Initial Visit)    BACK PAIN THAT RADIATES DOWN R HIP PAIN FOR 3  YRS JUST GETTING WOSRE.PIAN COMES AND GOES. NO INJURY OR INJECTIONS. WENT TO [facility] AND PSYCHOLOGIST REFERRED HERE  TO DISCUSS OPTIONS    HPI  Mr. [name] is a 42 year old white male who is seen today at the request of Angel Riding, MD for evaluation of chronic intermittent low back pain with right leg radiation.  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  He has had the symptoms for a while but was concerned about his pain and he was referred to [place] for evaluation.  He apparently was resting over the weekend his pain has certainly improved.   When he does have his pain and is presently noted in the lumbar spine down the right buttock and then down the posterior aspect of his leg but does not go into his foot.  He denies any numbness or tingling into the leg.  Denies any groin pain.  Denies any weakness.  He does see a chiropractor who does some manipulations and has been beneficial for that.  Denies any previous surgery on the lumbar spine.  Review of Systems  Constitutional: Negative for fatigue and fever.  HENT: Negative for ear pain.   Eyes: Negative for pain.  Respiratory: Negative for cough and shortness of breath.   Cardiovascular: Negative for leg swelling.  Genitourinary: Negative for difficulty urinating.  Musculoskeletal: Positive for back pain. Negative for neck pain.  Skin: Negative for rash.  Allergic/Immunologic: Negative for food allergies.  Neurological: Positive for weakness. Negative for numbness.  Hematological: Does not bruise/bleed easily.  Psychiatric/Behavioral: Positive for sleep disturbance.     Objective: Vital Signs: BP 124/87 (BP Location: Right Arm, Patient Position: Sitting, Cuff Size: Normal)   Pulse 88   Ht 5\' 8"  (1.727 m)   Wt 250 lb (113.4 kg)   BMI 38.01 kg/m   Physical  Exam  Constitutional: He is oriented to person, place, and time. He appears well-developed and well-nourished.  HENT:  Mouth/Throat: Oropharynx is clear and moist.  Eyes: Pupils are equal, round, and reactive to light. EOM are normal.  Pulmonary/Chest: Effort normal.  Neurological: He is alert and oriented to person, place, and time.  Skin: Skin is warm and dry.  Psychiatric: He has a normal mood and affect. His behavior is normal.    Back Exam   Tenderness  The patient is experiencing no tenderness.   Muscle Strength  Right Quadriceps:  4/5  Left Quadriceps:  4/5  Right Hamstrings:  4/5  Left Hamstrings:  4/5   Tests  Straight leg raise right: negative Straight leg raise left: negative  Reflexes    Patellar: 2/4 (Bilateral symmetric) Achilles: 2/4 (Lateral symmetric)  Other  Sensation: normal Gait: normal     He has good strength in the EHL 2 to gastroc bilaterally and symmetric. Negative straight leg raising bilaterally.  Right hip reveals full motion without pain or discomfort.  No tenderness along the trochanteric region.  Calf is supple nontender.  Specialty Comments:  No specialty comments available.  Imaging: Xr Hip Unilat W Or W/o Pelvis 2-3 Views Right  Result Date: 11/20/2017 AP pelvis and right hip reveals it appears to be lumbarization of S1.  Some changes in the SI joint more on the right than on the left.  There is an exostosis noted on the femoral neck on the lateral just superior to the lesser trochanter.  Xr Lumbar Spine 2-3 Views  Result Date: 11/20/2017 2 view x-ray lumbar spine does reveal some anterior spurring on the lateral at L4 and 5.  There is some disc space narrowing at that area and some posterior spurring is also noted.  Possibly some narrowing it at the L4-5 foramen due to spurs.  There is some anterior spurring at the superior portion of the tube.    PMFS History: Current Outpatient Medications  Medication Sig Dispense Refill  . alclomethasone (ACLOVATE) 0.05 % cream APPLY TO AFFECTED AREA EVERY DAY AS NEEDED  0  . atorvastatin (LIPITOR) 20 MG tablet Take 20 mg by mouth daily.  3  . BIOGAIA PROBIOTIC (BIOGAIA PROBIOTIC) LIQD Take by mouth daily at 8 pm.    . cetirizine (ZYRTEC) 10 MG tablet Take 10 mg by mouth daily.    . Cholecalciferol (VITAMIN D3) 1000 units CAPS Take by mouth.    . Cyanocobalamin (VITAMIN B 12 PO) Take by mouth.    . cycloSPORINE (RESTASIS) 0.05 % ophthalmic emulsion 1 drop 2 (two) times daily.    . DULoxetine (CYMBALTA) 60 MG capsule Take 2 capsules (120 mg total) by mouth daily. 180 capsule 0  . Flaxseed, Linseed, (FLAXSEED OIL) 1000 MG CAPS Take by mouth.    . gabapentin (NEURONTIN) 300 MG capsule Take 2 capsules  (600 mg total) by mouth at bedtime. 180 capsule 1  . irbesartan (AVAPRO) 75 MG tablet Take 75 mg daily by mouth.  3  . Multiple Vitamin (MULTIVITAMIN) tablet Take 1 tablet by mouth daily.    . Omega-3 Fatty Acids (FISH OIL) 1000 MG CPDR Take by mouth.    . pentosan polysulfate (ELMIRON) 100 MG capsule Take 100 mg by mouth 3 (three) times daily.    Marland Kitchen triamterene-hydrochlorothiazide (DYAZIDE) 37.5-25 MG capsule Take 1 capsule by mouth daily.    Marland Kitchen triamterene-hydrochlorothiazide (MAXZIDE-25) 37.5-25 MG tablet TAKE 1 TABLET BY MOUTH EVERY DAY IN THE MORNING  1  .  ezetimibe-simvastatin (VYTORIN) 10-10 MG tablet Take 1 tablet by mouth at bedtime.     No current facility-administered medications for this visit.     Patient Active Problem List   Diagnosis Date Noted  . Generalized anxiety disorder 01/17/2016   Past Medical History:  Diagnosis Date  . Anxiety   . Depression     Family History  Problem Relation Age of Onset  . Depression Mother     Past Surgical History:  Procedure Laterality Date  . APPENDECTOMY    . WISDOM TOOTH EXTRACTION     Social History   Occupational History  . Not on file  Tobacco Use  . Smoking status: Former Smoker    Packs/day: 0.50    Types: Cigarettes    Last attempt to quit: 01/16/2009    Years since quitting: 8.8  . Smokeless tobacco: Never Used  Substance and Sexual Activity  . Alcohol use: Yes    Alcohol/week: 5.0 standard drinks    Types: 5 Shots of liquor per week  . Drug use: No  . Sexual activity: Not Currently

## 2017-11-22 ENCOUNTER — Ambulatory Visit (INDEPENDENT_AMBULATORY_CARE_PROVIDER_SITE_OTHER): Payer: BLUE CROSS/BLUE SHIELD | Admitting: Psychiatry

## 2017-11-22 DIAGNOSIS — F411 Generalized anxiety disorder: Secondary | ICD-10-CM

## 2017-11-23 NOTE — Progress Notes (Signed)
   THERAPIST PROGRESS NOTE  Session Time:2:00-2:30  Participation Level:Active  Behavioral Response:CasualAlertEuthymic  Type of Therapy: Individual Therapy  Treatment Goals addressed: Coping; developing self-care  Interventions:CBT and Solution Focused  Summary: Angel Bell a 42y.o.malewho presents with generalized anxiety disorder.   Suicidal/Homicidal:Nowithout intent/plan  Therapist Response:Pt.continues to present with  euthymic mood. Pt. Discussed that he has lower back pain that he saw orthopedic doctor for. Pt. 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 Pt. Discussed that he recently took one week off of work and is looking forward to two week vacation in [place] and [place]. Pt. Will be traveling with friends and is looking forward to the social aspects of the trip in addition to having time to spend alone. Pt. Discussed that his friend groups in [place] is well and is looking forward to spending time with old friends when he leaves the country. Pt. Continues to site his most significant stressor as work related relationships, but continues to report that his work is overall a positive factor in his life. Pt. Reports that new development is that he is becoming more immediate in his interpersonal communications and finding that he has relief from stress when he frees himself to say what is on his mind.  Plan: Return again in 4weeks.Continue with CBT based therapy.  Diagnosis:Generalized Anxiety Disorder     Angel Bell, [facility] 11/23/2017

## 2017-11-30 DIAGNOSIS — M9902 Segmental and somatic dysfunction of thoracic region: Secondary | ICD-10-CM | POA: Diagnosis not present

## 2017-11-30 DIAGNOSIS — M9901 Segmental and somatic dysfunction of cervical region: Secondary | ICD-10-CM | POA: Diagnosis not present

## 2017-11-30 DIAGNOSIS — M47814 Spondylosis without myelopathy or radiculopathy, thoracic region: Secondary | ICD-10-CM | POA: Diagnosis not present

## 2017-11-30 DIAGNOSIS — M4003 Postural kyphosis, cervicothoracic region: Secondary | ICD-10-CM | POA: Diagnosis not present

## 2017-12-12 ENCOUNTER — Encounter (HOSPITAL_COMMUNITY): Payer: Self-pay | Admitting: Psychiatry

## 2018-01-04 DIAGNOSIS — M4003 Postural kyphosis, cervicothoracic region: Secondary | ICD-10-CM | POA: Diagnosis not present

## 2018-01-04 DIAGNOSIS — M9901 Segmental and somatic dysfunction of cervical region: Secondary | ICD-10-CM | POA: Diagnosis not present

## 2018-01-04 DIAGNOSIS — M9902 Segmental and somatic dysfunction of thoracic region: Secondary | ICD-10-CM | POA: Diagnosis not present

## 2018-01-04 DIAGNOSIS — M47814 Spondylosis without myelopathy or radiculopathy, thoracic region: Secondary | ICD-10-CM | POA: Diagnosis not present

## 2018-01-23 DIAGNOSIS — F3341 Major depressive disorder, recurrent, in partial remission: Secondary | ICD-10-CM | POA: Diagnosis not present

## 2018-01-30 DIAGNOSIS — L409 Psoriasis, unspecified: Secondary | ICD-10-CM | POA: Diagnosis not present

## 2018-01-30 DIAGNOSIS — D2261 Melanocytic nevi of right upper limb, including shoulder: Secondary | ICD-10-CM | POA: Diagnosis not present

## 2018-01-30 DIAGNOSIS — D485 Neoplasm of uncertain behavior of skin: Secondary | ICD-10-CM | POA: Diagnosis not present

## 2018-01-30 DIAGNOSIS — D225 Melanocytic nevi of trunk: Secondary | ICD-10-CM | POA: Diagnosis not present

## 2018-01-30 DIAGNOSIS — L219 Seborrheic dermatitis, unspecified: Secondary | ICD-10-CM | POA: Diagnosis not present

## 2018-02-01 DIAGNOSIS — M9901 Segmental and somatic dysfunction of cervical region: Secondary | ICD-10-CM | POA: Diagnosis not present

## 2018-02-01 DIAGNOSIS — M47814 Spondylosis without myelopathy or radiculopathy, thoracic region: Secondary | ICD-10-CM | POA: Diagnosis not present

## 2018-02-01 DIAGNOSIS — M4003 Postural kyphosis, cervicothoracic region: Secondary | ICD-10-CM | POA: Diagnosis not present

## 2018-02-01 DIAGNOSIS — M9902 Segmental and somatic dysfunction of thoracic region: Secondary | ICD-10-CM | POA: Diagnosis not present

## 2018-03-04 DIAGNOSIS — M9902 Segmental and somatic dysfunction of thoracic region: Secondary | ICD-10-CM | POA: Diagnosis not present

## 2018-03-04 DIAGNOSIS — M9901 Segmental and somatic dysfunction of cervical region: Secondary | ICD-10-CM | POA: Diagnosis not present

## 2018-03-04 DIAGNOSIS — M4003 Postural kyphosis, cervicothoracic region: Secondary | ICD-10-CM | POA: Diagnosis not present

## 2018-03-04 DIAGNOSIS — M47814 Spondylosis without myelopathy or radiculopathy, thoracic region: Secondary | ICD-10-CM | POA: Diagnosis not present

## 2018-03-15 DIAGNOSIS — Z0000 Encounter for general adult medical examination without abnormal findings: Secondary | ICD-10-CM | POA: Diagnosis not present

## 2018-03-15 DIAGNOSIS — Z23 Encounter for immunization: Secondary | ICD-10-CM | POA: Diagnosis not present

## 2018-03-15 DIAGNOSIS — E7800 Pure hypercholesterolemia, unspecified: Secondary | ICD-10-CM | POA: Diagnosis not present

## 2018-03-15 DIAGNOSIS — Z131 Encounter for screening for diabetes mellitus: Secondary | ICD-10-CM | POA: Diagnosis not present

## 2018-04-08 DIAGNOSIS — M4003 Postural kyphosis, cervicothoracic region: Secondary | ICD-10-CM | POA: Diagnosis not present

## 2018-04-08 DIAGNOSIS — M9902 Segmental and somatic dysfunction of thoracic region: Secondary | ICD-10-CM | POA: Diagnosis not present

## 2018-04-08 DIAGNOSIS — M9901 Segmental and somatic dysfunction of cervical region: Secondary | ICD-10-CM | POA: Diagnosis not present

## 2018-04-08 DIAGNOSIS — M47814 Spondylosis without myelopathy or radiculopathy, thoracic region: Secondary | ICD-10-CM | POA: Diagnosis not present

## 2018-04-16 DIAGNOSIS — R102 Pelvic and perineal pain: Secondary | ICD-10-CM | POA: Diagnosis not present

## 2018-04-30 DIAGNOSIS — R102 Pelvic and perineal pain: Secondary | ICD-10-CM | POA: Diagnosis not present

## 2018-04-30 DIAGNOSIS — N3010 Interstitial cystitis (chronic) without hematuria: Secondary | ICD-10-CM | POA: Diagnosis not present

## 2018-04-30 DIAGNOSIS — M6289 Other specified disorders of muscle: Secondary | ICD-10-CM | POA: Diagnosis not present

## 2018-04-30 DIAGNOSIS — M6281 Muscle weakness (generalized): Secondary | ICD-10-CM | POA: Diagnosis not present

## 2018-05-06 DIAGNOSIS — M9901 Segmental and somatic dysfunction of cervical region: Secondary | ICD-10-CM | POA: Diagnosis not present

## 2018-05-06 DIAGNOSIS — M9902 Segmental and somatic dysfunction of thoracic region: Secondary | ICD-10-CM | POA: Diagnosis not present

## 2018-05-06 DIAGNOSIS — M47814 Spondylosis without myelopathy or radiculopathy, thoracic region: Secondary | ICD-10-CM | POA: Diagnosis not present

## 2018-05-06 DIAGNOSIS — M4003 Postural kyphosis, cervicothoracic region: Secondary | ICD-10-CM | POA: Diagnosis not present

## 2018-05-10 DIAGNOSIS — F3341 Major depressive disorder, recurrent, in partial remission: Secondary | ICD-10-CM | POA: Diagnosis not present

## 2018-05-14 DIAGNOSIS — R300 Dysuria: Secondary | ICD-10-CM | POA: Diagnosis not present

## 2018-05-14 DIAGNOSIS — M6281 Muscle weakness (generalized): Secondary | ICD-10-CM | POA: Diagnosis not present

## 2018-05-14 DIAGNOSIS — R102 Pelvic and perineal pain: Secondary | ICD-10-CM | POA: Diagnosis not present

## 2018-05-14 DIAGNOSIS — M62838 Other muscle spasm: Secondary | ICD-10-CM | POA: Diagnosis not present

## 2018-05-22 DIAGNOSIS — M62838 Other muscle spasm: Secondary | ICD-10-CM | POA: Diagnosis not present

## 2018-05-22 DIAGNOSIS — R102 Pelvic and perineal pain: Secondary | ICD-10-CM | POA: Diagnosis not present

## 2018-05-22 DIAGNOSIS — M6281 Muscle weakness (generalized): Secondary | ICD-10-CM | POA: Diagnosis not present

## 2018-05-22 DIAGNOSIS — M6289 Other specified disorders of muscle: Secondary | ICD-10-CM | POA: Diagnosis not present

## 2018-06-03 DIAGNOSIS — M9901 Segmental and somatic dysfunction of cervical region: Secondary | ICD-10-CM | POA: Diagnosis not present

## 2018-06-03 DIAGNOSIS — M4003 Postural kyphosis, cervicothoracic region: Secondary | ICD-10-CM | POA: Diagnosis not present

## 2018-06-03 DIAGNOSIS — M9902 Segmental and somatic dysfunction of thoracic region: Secondary | ICD-10-CM | POA: Diagnosis not present

## 2018-06-03 DIAGNOSIS — M47814 Spondylosis without myelopathy or radiculopathy, thoracic region: Secondary | ICD-10-CM | POA: Diagnosis not present

## 2018-07-23 DIAGNOSIS — R102 Pelvic and perineal pain: Secondary | ICD-10-CM | POA: Diagnosis not present

## 2018-07-24 DIAGNOSIS — M47814 Spondylosis without myelopathy or radiculopathy, thoracic region: Secondary | ICD-10-CM | POA: Diagnosis not present

## 2018-07-24 DIAGNOSIS — M4003 Postural kyphosis, cervicothoracic region: Secondary | ICD-10-CM | POA: Diagnosis not present

## 2018-07-24 DIAGNOSIS — M9902 Segmental and somatic dysfunction of thoracic region: Secondary | ICD-10-CM | POA: Diagnosis not present

## 2018-07-24 DIAGNOSIS — M9901 Segmental and somatic dysfunction of cervical region: Secondary | ICD-10-CM | POA: Diagnosis not present

## 2018-07-26 DIAGNOSIS — M47814 Spondylosis without myelopathy or radiculopathy, thoracic region: Secondary | ICD-10-CM | POA: Diagnosis not present

## 2018-07-26 DIAGNOSIS — M9901 Segmental and somatic dysfunction of cervical region: Secondary | ICD-10-CM | POA: Diagnosis not present

## 2018-07-26 DIAGNOSIS — M4003 Postural kyphosis, cervicothoracic region: Secondary | ICD-10-CM | POA: Diagnosis not present

## 2018-07-26 DIAGNOSIS — M9902 Segmental and somatic dysfunction of thoracic region: Secondary | ICD-10-CM | POA: Diagnosis not present

## 2018-07-31 DIAGNOSIS — M9902 Segmental and somatic dysfunction of thoracic region: Secondary | ICD-10-CM | POA: Diagnosis not present

## 2018-07-31 DIAGNOSIS — M9901 Segmental and somatic dysfunction of cervical region: Secondary | ICD-10-CM | POA: Diagnosis not present

## 2018-07-31 DIAGNOSIS — M4003 Postural kyphosis, cervicothoracic region: Secondary | ICD-10-CM | POA: Diagnosis not present

## 2018-07-31 DIAGNOSIS — M47814 Spondylosis without myelopathy or radiculopathy, thoracic region: Secondary | ICD-10-CM | POA: Diagnosis not present

## 2018-08-02 DIAGNOSIS — M9901 Segmental and somatic dysfunction of cervical region: Secondary | ICD-10-CM | POA: Diagnosis not present

## 2018-08-02 DIAGNOSIS — M47814 Spondylosis without myelopathy or radiculopathy, thoracic region: Secondary | ICD-10-CM | POA: Diagnosis not present

## 2018-08-02 DIAGNOSIS — M4003 Postural kyphosis, cervicothoracic region: Secondary | ICD-10-CM | POA: Diagnosis not present

## 2018-08-02 DIAGNOSIS — M9902 Segmental and somatic dysfunction of thoracic region: Secondary | ICD-10-CM | POA: Diagnosis not present

## 2018-08-06 DIAGNOSIS — M9901 Segmental and somatic dysfunction of cervical region: Secondary | ICD-10-CM | POA: Diagnosis not present

## 2018-08-06 DIAGNOSIS — M47814 Spondylosis without myelopathy or radiculopathy, thoracic region: Secondary | ICD-10-CM | POA: Diagnosis not present

## 2018-08-06 DIAGNOSIS — M9902 Segmental and somatic dysfunction of thoracic region: Secondary | ICD-10-CM | POA: Diagnosis not present

## 2018-08-06 DIAGNOSIS — M4003 Postural kyphosis, cervicothoracic region: Secondary | ICD-10-CM | POA: Diagnosis not present

## 2018-08-09 DIAGNOSIS — M9902 Segmental and somatic dysfunction of thoracic region: Secondary | ICD-10-CM | POA: Diagnosis not present

## 2018-08-09 DIAGNOSIS — M47814 Spondylosis without myelopathy or radiculopathy, thoracic region: Secondary | ICD-10-CM | POA: Diagnosis not present

## 2018-08-09 DIAGNOSIS — M9901 Segmental and somatic dysfunction of cervical region: Secondary | ICD-10-CM | POA: Diagnosis not present

## 2018-08-09 DIAGNOSIS — M4003 Postural kyphosis, cervicothoracic region: Secondary | ICD-10-CM | POA: Diagnosis not present

## 2018-08-13 ENCOUNTER — Ambulatory Visit: Payer: Self-pay

## 2018-08-13 ENCOUNTER — Encounter: Payer: Self-pay | Admitting: Orthopaedic Surgery

## 2018-08-13 ENCOUNTER — Ambulatory Visit (INDEPENDENT_AMBULATORY_CARE_PROVIDER_SITE_OTHER): Payer: BLUE CROSS/BLUE SHIELD | Admitting: Orthopaedic Surgery

## 2018-08-13 ENCOUNTER — Other Ambulatory Visit: Payer: Self-pay

## 2018-08-13 VITALS — BP 151/91 | HR 94 | Resp 16 | Ht 68.0 in | Wt 250.0 lb

## 2018-08-13 DIAGNOSIS — M5412 Radiculopathy, cervical region: Secondary | ICD-10-CM | POA: Diagnosis not present

## 2018-08-13 DIAGNOSIS — M4722 Other spondylosis with radiculopathy, cervical region: Secondary | ICD-10-CM | POA: Diagnosis not present

## 2018-08-13 NOTE — Progress Notes (Signed)
Office Visit Note   Patient: KELCY BAETEN           Date of Birth: 04/18/1975           MRN: [id number] Visit Date: 08/13/2018              Requested by: Lujean Amel, MD [place], [place] [address] PCP: Lujean Amel, MD   Assessment & Plan: Visit Diagnoses:  1. Cervical radiculopathy   2. Osteoarthritis of spine with radiculopathy, cervical region     Plan:  #1: Since his symptoms are resolving no further and will be performed. #2: He may call in the interim if his symptoms return or with worsen and an MRI can be ordered..  Follow-Up Instructions: Return if symptoms worsen or fail to improve.   Face-to-face time spent with patient was greater than 30 minutes.  Greater than 50% of the time was spent in counseling and coordination of care.  Orders:  Orders Placed This Encounter  Procedures  . XR Cervical Spine 2 or 3 views  . XR Shoulder Right   No orders of the defined types were placed in this encounter.     Procedures: No procedures performed   Clinical Data: No additional findings.   Subjective: Chief Complaint  Patient presents with  . Neck - Pain   HPI Mr.  Schrieber is a 43 year old male who presents with neck pain and right arm pain for years worsening x 3 weeks. He works for [employer] has been doing continuous motion. He is seeing a chiropractor and exercising which helps. He has had weakness, numbness and tingling in his right arm radiating to his right hand into his ring and little finger.. He has not had any surgery or injury to his shoulder or neck. He is not diabetic.  He states that it is improved recently.  His symptoms are not as pronounced now.  He is not having problems with his ring and index finger today.   Review of Systems  Constitutional: Positive for fatigue.  HENT: Negative for trouble swallowing.   Eyes: Negative for pain.  Respiratory: Negative for shortness of breath.   Cardiovascular: Negative for  leg swelling.  Gastrointestinal: Negative for constipation.  Endocrine: Negative for heat intolerance.  Genitourinary: Negative for difficulty urinating.  Musculoskeletal: Positive for neck pain and neck stiffness.  Skin: Negative for rash.  Allergic/Immunologic: Positive for environmental allergies.  Neurological: Positive for weakness and numbness.  Hematological: Does not bruise/bleed easily.  Psychiatric/Behavioral: Positive for sleep disturbance.     Objective: Vital Signs: BP (!) 151/91 (BP Location: Left Arm, Patient Position: Sitting, Cuff Size: Normal)   Pulse 94   Resp 16   Ht 5\' 8"  (1.727 m)   Wt 250 lb (113.4 kg)   BMI 38.01 kg/m   Physical Exam Constitutional:      Appearance: Normal appearance. He is well-developed.  HENT:     Head: Normocephalic.  Eyes:     Pupils: Pupils are equal, round, and reactive to light.  Pulmonary:     Effort: Pulmonary effort is normal.  Skin:    General: Skin is warm and dry.  Neurological:     Mental Status: He is alert and oriented to person, place, and time.  Psychiatric:        Behavior: Behavior normal.        Thought Content: Thought content normal.        Judgment: Judgment normal.  Ortho Exam  Cervical spine today reveals right and left rotation to around 60 degrees to 70 degrees.  Backward extension 45 degrees.  Forward flexion to chin almost to chest.  He has good strength in all planes tested in the upper extremities.  Is got near full range of motion of the right shoulder.  Good strength in abduction forward flexion and internal and external rotation of the shoulders.  Negative impingement.  Sensation is intact to light touch in both upper extremities.  Tendon reflexes are absent in all areas tested in the upper extremity.  Specialty Comments:  No specialty comments available.  Imaging: Xr Cervical Spine 2 Or 3 Views  Result Date: 08/13/2018 X-rays of the right shoulder reveals some degenerative changes at  the [facility] joint with sclerosing noted.  Type I acromium possible mild 2.  Xr Shoulder Right  Result Date: 08/13/2018 Three-view x-rays of the cervical spine reveals loss of lordotic curve.  Some calcification of the anterior longitudinal ligament coming off of C4.  This is also where the apex of his flexion occurs.  Some mild degenerative disc space narrowing.  Fragmentation of the C6 posterior spinous process    PMFS History: Current Outpatient Medications  Medication Sig Dispense Refill  . atorvastatin (LIPITOR) 20 MG tablet Take 20 mg by mouth daily.  3  . BIOGAIA PROBIOTIC (BIOGAIA PROBIOTIC) LIQD Take by mouth daily at 8 pm.    . cetirizine (ZYRTEC) 10 MG tablet Take 10 mg by mouth daily.    . Cholecalciferol (VITAMIN D3) 1000 units CAPS Take by mouth.    . Cyanocobalamin (VITAMIN B 12 PO) Take by mouth.    . cycloSPORINE (RESTASIS) 0.05 % ophthalmic emulsion 1 drop 2 (two) times daily.    . DULoxetine (CYMBALTA) 60 MG capsule Take 2 capsules (120 mg total) by mouth daily. 180 capsule 0  . ezetimibe-simvastatin (VYTORIN) 10-10 MG tablet Take 1 tablet by mouth at bedtime.    . Flaxseed, Linseed, (FLAXSEED OIL) 1000 MG CAPS Take by mouth.    . gabapentin (NEURONTIN) 300 MG capsule Take 2 capsules (600 mg total) by mouth at bedtime. 180 capsule 1  . hydrocortisone 2.5 % cream     . irbesartan (AVAPRO) 75 MG tablet Take 75 mg daily by mouth.  3  . Multiple Vitamin (MULTIVITAMIN) tablet Take 1 tablet by mouth daily.    . Omega-3 Fatty Acids (FISH OIL) 1000 MG CPDR Take by mouth.    . triamterene-hydrochlorothiazide (DYAZIDE) 37.5-25 MG capsule Take 1 capsule by mouth daily.    Marland Kitchen triamterene-hydrochlorothiazide (MAXZIDE-25) 37.5-25 MG tablet TAKE 1 TABLET BY MOUTH EVERY DAY IN THE MORNING  1  . alclomethasone (ACLOVATE) 0.05 % cream APPLY TO AFFECTED AREA EVERY DAY AS NEEDED  0  . pentosan polysulfate (ELMIRON) 100 MG capsule Take 100 mg by mouth 3 (three) times daily.     No current  facility-administered medications for this visit.     Patient Active Problem List   Diagnosis Date Noted  . Generalized anxiety disorder 01/17/2016   Past Medical History:  Diagnosis Date  . Anxiety   . Depression     Family History  Problem Relation Age of Onset  . Depression Mother     Past Surgical History:  Procedure Laterality Date  . APPENDECTOMY    . WISDOM TOOTH EXTRACTION     Social History   Occupational History  . Not on file  Tobacco Use  . Smoking status: Former Smoker  Packs/day: 0.50    Types: Cigarettes    Last attempt to quit: 01/16/2009    Years since quitting: 9.5  . Smokeless tobacco: Never Used  Substance and Sexual Activity  . Alcohol use: Yes    Alcohol/week: 5.0 standard drinks    Types: 5 Shots of liquor per week  . Drug use: No  . Sexual activity: Not Currently

## 2018-08-14 DIAGNOSIS — M4003 Postural kyphosis, cervicothoracic region: Secondary | ICD-10-CM | POA: Diagnosis not present

## 2018-08-14 DIAGNOSIS — M9901 Segmental and somatic dysfunction of cervical region: Secondary | ICD-10-CM | POA: Diagnosis not present

## 2018-08-14 DIAGNOSIS — M9902 Segmental and somatic dysfunction of thoracic region: Secondary | ICD-10-CM | POA: Diagnosis not present

## 2018-08-14 DIAGNOSIS — M47814 Spondylosis without myelopathy or radiculopathy, thoracic region: Secondary | ICD-10-CM | POA: Diagnosis not present

## 2018-08-22 DIAGNOSIS — M9901 Segmental and somatic dysfunction of cervical region: Secondary | ICD-10-CM | POA: Diagnosis not present

## 2018-08-22 DIAGNOSIS — M9902 Segmental and somatic dysfunction of thoracic region: Secondary | ICD-10-CM | POA: Diagnosis not present

## 2018-08-22 DIAGNOSIS — M4003 Postural kyphosis, cervicothoracic region: Secondary | ICD-10-CM | POA: Diagnosis not present

## 2018-08-22 DIAGNOSIS — M47814 Spondylosis without myelopathy or radiculopathy, thoracic region: Secondary | ICD-10-CM | POA: Diagnosis not present

## 2018-08-29 DIAGNOSIS — M9901 Segmental and somatic dysfunction of cervical region: Secondary | ICD-10-CM | POA: Diagnosis not present

## 2018-08-29 DIAGNOSIS — M47814 Spondylosis without myelopathy or radiculopathy, thoracic region: Secondary | ICD-10-CM | POA: Diagnosis not present

## 2018-08-29 DIAGNOSIS — M9902 Segmental and somatic dysfunction of thoracic region: Secondary | ICD-10-CM | POA: Diagnosis not present

## 2018-08-29 DIAGNOSIS — M4003 Postural kyphosis, cervicothoracic region: Secondary | ICD-10-CM | POA: Diagnosis not present

## 2018-09-12 DIAGNOSIS — M9901 Segmental and somatic dysfunction of cervical region: Secondary | ICD-10-CM | POA: Diagnosis not present

## 2018-09-12 DIAGNOSIS — M9902 Segmental and somatic dysfunction of thoracic region: Secondary | ICD-10-CM | POA: Diagnosis not present

## 2018-09-12 DIAGNOSIS — M47814 Spondylosis without myelopathy or radiculopathy, thoracic region: Secondary | ICD-10-CM | POA: Diagnosis not present

## 2018-09-12 DIAGNOSIS — M4003 Postural kyphosis, cervicothoracic region: Secondary | ICD-10-CM | POA: Diagnosis not present

## 2018-09-13 DIAGNOSIS — F3341 Major depressive disorder, recurrent, in partial remission: Secondary | ICD-10-CM | POA: Diagnosis not present

## 2018-11-05 DIAGNOSIS — M47814 Spondylosis without myelopathy or radiculopathy, thoracic region: Secondary | ICD-10-CM | POA: Diagnosis not present

## 2018-11-05 DIAGNOSIS — M4003 Postural kyphosis, cervicothoracic region: Secondary | ICD-10-CM | POA: Diagnosis not present

## 2018-11-05 DIAGNOSIS — M9901 Segmental and somatic dysfunction of cervical region: Secondary | ICD-10-CM | POA: Diagnosis not present

## 2018-11-05 DIAGNOSIS — M9902 Segmental and somatic dysfunction of thoracic region: Secondary | ICD-10-CM | POA: Diagnosis not present

## 2018-11-20 DIAGNOSIS — F3341 Major depressive disorder, recurrent, in partial remission: Secondary | ICD-10-CM | POA: Diagnosis not present

## 2018-11-26 DIAGNOSIS — M9901 Segmental and somatic dysfunction of cervical region: Secondary | ICD-10-CM | POA: Diagnosis not present

## 2018-11-26 DIAGNOSIS — M4003 Postural kyphosis, cervicothoracic region: Secondary | ICD-10-CM | POA: Diagnosis not present

## 2018-11-26 DIAGNOSIS — M47814 Spondylosis without myelopathy or radiculopathy, thoracic region: Secondary | ICD-10-CM | POA: Diagnosis not present

## 2018-11-26 DIAGNOSIS — M9902 Segmental and somatic dysfunction of thoracic region: Secondary | ICD-10-CM | POA: Diagnosis not present

## 2018-12-17 DIAGNOSIS — M9901 Segmental and somatic dysfunction of cervical region: Secondary | ICD-10-CM | POA: Diagnosis not present

## 2018-12-17 DIAGNOSIS — M9902 Segmental and somatic dysfunction of thoracic region: Secondary | ICD-10-CM | POA: Diagnosis not present

## 2018-12-17 DIAGNOSIS — M47814 Spondylosis without myelopathy or radiculopathy, thoracic region: Secondary | ICD-10-CM | POA: Diagnosis not present

## 2018-12-17 DIAGNOSIS — M4003 Postural kyphosis, cervicothoracic region: Secondary | ICD-10-CM | POA: Diagnosis not present

## 2019-01-16 DIAGNOSIS — M9901 Segmental and somatic dysfunction of cervical region: Secondary | ICD-10-CM | POA: Diagnosis not present

## 2019-01-16 DIAGNOSIS — M9902 Segmental and somatic dysfunction of thoracic region: Secondary | ICD-10-CM | POA: Diagnosis not present

## 2019-01-16 DIAGNOSIS — M4003 Postural kyphosis, cervicothoracic region: Secondary | ICD-10-CM | POA: Diagnosis not present

## 2019-01-16 DIAGNOSIS — M47814 Spondylosis without myelopathy or radiculopathy, thoracic region: Secondary | ICD-10-CM | POA: Diagnosis not present

## 2019-02-06 DIAGNOSIS — M47814 Spondylosis without myelopathy or radiculopathy, thoracic region: Secondary | ICD-10-CM | POA: Diagnosis not present

## 2019-02-06 DIAGNOSIS — M4003 Postural kyphosis, cervicothoracic region: Secondary | ICD-10-CM | POA: Diagnosis not present

## 2019-02-06 DIAGNOSIS — M9901 Segmental and somatic dysfunction of cervical region: Secondary | ICD-10-CM | POA: Diagnosis not present

## 2019-02-06 DIAGNOSIS — M9902 Segmental and somatic dysfunction of thoracic region: Secondary | ICD-10-CM | POA: Diagnosis not present

## 2019-02-19 DIAGNOSIS — F3341 Major depressive disorder, recurrent, in partial remission: Secondary | ICD-10-CM | POA: Diagnosis not present

## 2019-03-06 DIAGNOSIS — M9902 Segmental and somatic dysfunction of thoracic region: Secondary | ICD-10-CM | POA: Diagnosis not present

## 2019-03-06 DIAGNOSIS — M47814 Spondylosis without myelopathy or radiculopathy, thoracic region: Secondary | ICD-10-CM | POA: Diagnosis not present

## 2019-03-06 DIAGNOSIS — M4003 Postural kyphosis, cervicothoracic region: Secondary | ICD-10-CM | POA: Diagnosis not present

## 2019-03-06 DIAGNOSIS — M9901 Segmental and somatic dysfunction of cervical region: Secondary | ICD-10-CM | POA: Diagnosis not present

## 2019-04-03 DIAGNOSIS — M4003 Postural kyphosis, cervicothoracic region: Secondary | ICD-10-CM | POA: Diagnosis not present

## 2019-04-03 DIAGNOSIS — M9902 Segmental and somatic dysfunction of thoracic region: Secondary | ICD-10-CM | POA: Diagnosis not present

## 2019-04-03 DIAGNOSIS — M9901 Segmental and somatic dysfunction of cervical region: Secondary | ICD-10-CM | POA: Diagnosis not present

## 2019-04-03 DIAGNOSIS — M47814 Spondylosis without myelopathy or radiculopathy, thoracic region: Secondary | ICD-10-CM | POA: Diagnosis not present

## 2019-04-11 DIAGNOSIS — Z0000 Encounter for general adult medical examination without abnormal findings: Secondary | ICD-10-CM | POA: Diagnosis not present

## 2019-04-11 DIAGNOSIS — F411 Generalized anxiety disorder: Secondary | ICD-10-CM | POA: Diagnosis not present

## 2019-04-11 DIAGNOSIS — N3010 Interstitial cystitis (chronic) without hematuria: Secondary | ICD-10-CM | POA: Diagnosis not present

## 2019-04-11 DIAGNOSIS — E7800 Pure hypercholesterolemia, unspecified: Secondary | ICD-10-CM | POA: Diagnosis not present

## 2019-04-24 DIAGNOSIS — Z13220 Encounter for screening for lipoid disorders: Secondary | ICD-10-CM | POA: Diagnosis not present

## 2019-04-24 DIAGNOSIS — Z0000 Encounter for general adult medical examination without abnormal findings: Secondary | ICD-10-CM | POA: Diagnosis not present

## 2019-04-24 DIAGNOSIS — Z131 Encounter for screening for diabetes mellitus: Secondary | ICD-10-CM | POA: Diagnosis not present

## 2019-04-24 DIAGNOSIS — Z23 Encounter for immunization: Secondary | ICD-10-CM | POA: Diagnosis not present

## 2019-05-02 DIAGNOSIS — M9901 Segmental and somatic dysfunction of cervical region: Secondary | ICD-10-CM | POA: Diagnosis not present

## 2019-05-02 DIAGNOSIS — M9902 Segmental and somatic dysfunction of thoracic region: Secondary | ICD-10-CM | POA: Diagnosis not present

## 2019-05-02 DIAGNOSIS — M47814 Spondylosis without myelopathy or radiculopathy, thoracic region: Secondary | ICD-10-CM | POA: Diagnosis not present

## 2019-05-02 DIAGNOSIS — M4003 Postural kyphosis, cervicothoracic region: Secondary | ICD-10-CM | POA: Diagnosis not present

## 2019-05-30 DIAGNOSIS — M9901 Segmental and somatic dysfunction of cervical region: Secondary | ICD-10-CM | POA: Diagnosis not present

## 2019-05-30 DIAGNOSIS — M47814 Spondylosis without myelopathy or radiculopathy, thoracic region: Secondary | ICD-10-CM | POA: Diagnosis not present

## 2019-05-30 DIAGNOSIS — M4003 Postural kyphosis, cervicothoracic region: Secondary | ICD-10-CM | POA: Diagnosis not present

## 2019-05-30 DIAGNOSIS — M9902 Segmental and somatic dysfunction of thoracic region: Secondary | ICD-10-CM | POA: Diagnosis not present

## 2019-06-27 DIAGNOSIS — M9902 Segmental and somatic dysfunction of thoracic region: Secondary | ICD-10-CM | POA: Diagnosis not present

## 2019-06-27 DIAGNOSIS — M4003 Postural kyphosis, cervicothoracic region: Secondary | ICD-10-CM | POA: Diagnosis not present

## 2019-06-27 DIAGNOSIS — M47814 Spondylosis without myelopathy or radiculopathy, thoracic region: Secondary | ICD-10-CM | POA: Diagnosis not present

## 2019-06-27 DIAGNOSIS — M9901 Segmental and somatic dysfunction of cervical region: Secondary | ICD-10-CM | POA: Diagnosis not present

## 2019-07-22 DIAGNOSIS — M47814 Spondylosis without myelopathy or radiculopathy, thoracic region: Secondary | ICD-10-CM | POA: Diagnosis not present

## 2019-07-22 DIAGNOSIS — M9901 Segmental and somatic dysfunction of cervical region: Secondary | ICD-10-CM | POA: Diagnosis not present

## 2019-07-22 DIAGNOSIS — M9902 Segmental and somatic dysfunction of thoracic region: Secondary | ICD-10-CM | POA: Diagnosis not present

## 2019-07-22 DIAGNOSIS — M4003 Postural kyphosis, cervicothoracic region: Secondary | ICD-10-CM | POA: Diagnosis not present

## 2019-08-19 DIAGNOSIS — M4003 Postural kyphosis, cervicothoracic region: Secondary | ICD-10-CM | POA: Diagnosis not present

## 2019-08-19 DIAGNOSIS — M9901 Segmental and somatic dysfunction of cervical region: Secondary | ICD-10-CM | POA: Diagnosis not present

## 2019-08-19 DIAGNOSIS — M47814 Spondylosis without myelopathy or radiculopathy, thoracic region: Secondary | ICD-10-CM | POA: Diagnosis not present

## 2019-08-19 DIAGNOSIS — M9902 Segmental and somatic dysfunction of thoracic region: Secondary | ICD-10-CM | POA: Diagnosis not present

## 2019-09-09 DIAGNOSIS — M9902 Segmental and somatic dysfunction of thoracic region: Secondary | ICD-10-CM | POA: Diagnosis not present

## 2019-09-09 DIAGNOSIS — M47814 Spondylosis without myelopathy or radiculopathy, thoracic region: Secondary | ICD-10-CM | POA: Diagnosis not present

## 2019-09-09 DIAGNOSIS — M9901 Segmental and somatic dysfunction of cervical region: Secondary | ICD-10-CM | POA: Diagnosis not present

## 2019-09-09 DIAGNOSIS — M4003 Postural kyphosis, cervicothoracic region: Secondary | ICD-10-CM | POA: Diagnosis not present

## 2019-09-26 DIAGNOSIS — J00 Acute nasopharyngitis [common cold]: Secondary | ICD-10-CM | POA: Diagnosis not present

## 2019-09-26 DIAGNOSIS — R05 Cough: Secondary | ICD-10-CM | POA: Diagnosis not present

## 2019-09-30 DIAGNOSIS — J329 Chronic sinusitis, unspecified: Secondary | ICD-10-CM | POA: Diagnosis not present

## 2019-09-30 DIAGNOSIS — J40 Bronchitis, not specified as acute or chronic: Secondary | ICD-10-CM | POA: Diagnosis not present

## 2019-10-03 DIAGNOSIS — L409 Psoriasis, unspecified: Secondary | ICD-10-CM | POA: Diagnosis not present

## 2019-10-03 DIAGNOSIS — D225 Melanocytic nevi of trunk: Secondary | ICD-10-CM | POA: Diagnosis not present

## 2019-10-03 DIAGNOSIS — L578 Other skin changes due to chronic exposure to nonionizing radiation: Secondary | ICD-10-CM | POA: Diagnosis not present

## 2019-10-03 DIAGNOSIS — L570 Actinic keratosis: Secondary | ICD-10-CM | POA: Diagnosis not present

## 2019-10-03 DIAGNOSIS — L408 Other psoriasis: Secondary | ICD-10-CM | POA: Diagnosis not present

## 2019-10-07 DIAGNOSIS — M9902 Segmental and somatic dysfunction of thoracic region: Secondary | ICD-10-CM | POA: Diagnosis not present

## 2019-10-07 DIAGNOSIS — M4003 Postural kyphosis, cervicothoracic region: Secondary | ICD-10-CM | POA: Diagnosis not present

## 2019-10-07 DIAGNOSIS — M9901 Segmental and somatic dysfunction of cervical region: Secondary | ICD-10-CM | POA: Diagnosis not present

## 2019-10-07 DIAGNOSIS — M47814 Spondylosis without myelopathy or radiculopathy, thoracic region: Secondary | ICD-10-CM | POA: Diagnosis not present

## 2019-11-06 DIAGNOSIS — M47814 Spondylosis without myelopathy or radiculopathy, thoracic region: Secondary | ICD-10-CM | POA: Diagnosis not present

## 2019-11-06 DIAGNOSIS — M9902 Segmental and somatic dysfunction of thoracic region: Secondary | ICD-10-CM | POA: Diagnosis not present

## 2019-11-06 DIAGNOSIS — M4003 Postural kyphosis, cervicothoracic region: Secondary | ICD-10-CM | POA: Diagnosis not present

## 2019-11-06 DIAGNOSIS — M9901 Segmental and somatic dysfunction of cervical region: Secondary | ICD-10-CM | POA: Diagnosis not present

## 2019-12-04 DIAGNOSIS — M9901 Segmental and somatic dysfunction of cervical region: Secondary | ICD-10-CM | POA: Diagnosis not present

## 2019-12-04 DIAGNOSIS — M47814 Spondylosis without myelopathy or radiculopathy, thoracic region: Secondary | ICD-10-CM | POA: Diagnosis not present

## 2019-12-04 DIAGNOSIS — M4003 Postural kyphosis, cervicothoracic region: Secondary | ICD-10-CM | POA: Diagnosis not present

## 2019-12-04 DIAGNOSIS — M9902 Segmental and somatic dysfunction of thoracic region: Secondary | ICD-10-CM | POA: Diagnosis not present

## 2019-12-24 DIAGNOSIS — L719 Rosacea, unspecified: Secondary | ICD-10-CM | POA: Diagnosis not present

## 2020-01-01 DIAGNOSIS — M47814 Spondylosis without myelopathy or radiculopathy, thoracic region: Secondary | ICD-10-CM | POA: Diagnosis not present

## 2020-01-01 DIAGNOSIS — M9902 Segmental and somatic dysfunction of thoracic region: Secondary | ICD-10-CM | POA: Diagnosis not present

## 2020-01-01 DIAGNOSIS — M9901 Segmental and somatic dysfunction of cervical region: Secondary | ICD-10-CM | POA: Diagnosis not present

## 2020-01-01 DIAGNOSIS — M4003 Postural kyphosis, cervicothoracic region: Secondary | ICD-10-CM | POA: Diagnosis not present

## 2020-01-05 DIAGNOSIS — M2242 Chondromalacia patellae, left knee: Secondary | ICD-10-CM | POA: Diagnosis not present

## 2020-01-26 DIAGNOSIS — M2242 Chondromalacia patellae, left knee: Secondary | ICD-10-CM | POA: Diagnosis not present

## 2020-02-05 DIAGNOSIS — M25562 Pain in left knee: Secondary | ICD-10-CM | POA: Diagnosis not present

## 2020-02-06 DIAGNOSIS — M4003 Postural kyphosis, cervicothoracic region: Secondary | ICD-10-CM | POA: Diagnosis not present

## 2020-02-06 DIAGNOSIS — M9902 Segmental and somatic dysfunction of thoracic region: Secondary | ICD-10-CM | POA: Diagnosis not present

## 2020-02-06 DIAGNOSIS — M47814 Spondylosis without myelopathy or radiculopathy, thoracic region: Secondary | ICD-10-CM | POA: Diagnosis not present

## 2020-02-06 DIAGNOSIS — M9901 Segmental and somatic dysfunction of cervical region: Secondary | ICD-10-CM | POA: Diagnosis not present

## 2020-03-05 DIAGNOSIS — R2240 Localized swelling, mass and lump, unspecified lower limb: Secondary | ICD-10-CM | POA: Diagnosis not present

## 2020-03-12 DIAGNOSIS — M9901 Segmental and somatic dysfunction of cervical region: Secondary | ICD-10-CM | POA: Diagnosis not present

## 2020-03-12 DIAGNOSIS — M4003 Postural kyphosis, cervicothoracic region: Secondary | ICD-10-CM | POA: Diagnosis not present

## 2020-03-12 DIAGNOSIS — M9902 Segmental and somatic dysfunction of thoracic region: Secondary | ICD-10-CM | POA: Diagnosis not present

## 2020-03-12 DIAGNOSIS — M47814 Spondylosis without myelopathy or radiculopathy, thoracic region: Secondary | ICD-10-CM | POA: Diagnosis not present

## 2020-03-15 DIAGNOSIS — R2241 Localized swelling, mass and lump, right lower limb: Secondary | ICD-10-CM | POA: Diagnosis not present

## 2020-03-15 DIAGNOSIS — R2242 Localized swelling, mass and lump, left lower limb: Secondary | ICD-10-CM | POA: Diagnosis not present

## 2020-03-15 DIAGNOSIS — C4922 Malignant neoplasm of connective and soft tissue of left lower limb, including hip: Secondary | ICD-10-CM | POA: Diagnosis not present

## 2020-04-09 DIAGNOSIS — M9902 Segmental and somatic dysfunction of thoracic region: Secondary | ICD-10-CM | POA: Diagnosis not present

## 2020-04-09 DIAGNOSIS — M9901 Segmental and somatic dysfunction of cervical region: Secondary | ICD-10-CM | POA: Diagnosis not present

## 2020-04-09 DIAGNOSIS — M4003 Postural kyphosis, cervicothoracic region: Secondary | ICD-10-CM | POA: Diagnosis not present

## 2020-04-09 DIAGNOSIS — M47814 Spondylosis without myelopathy or radiculopathy, thoracic region: Secondary | ICD-10-CM | POA: Diagnosis not present

## 2020-04-28 DIAGNOSIS — C4922 Malignant neoplasm of connective and soft tissue of left lower limb, including hip: Secondary | ICD-10-CM | POA: Diagnosis not present

## 2020-04-28 DIAGNOSIS — C499 Malignant neoplasm of connective and soft tissue, unspecified: Secondary | ICD-10-CM | POA: Diagnosis not present

## 2020-04-28 DIAGNOSIS — C4920 Malignant neoplasm of connective and soft tissue of unspecified lower limb, including hip: Secondary | ICD-10-CM | POA: Diagnosis not present

## 2020-04-28 DIAGNOSIS — R599 Enlarged lymph nodes, unspecified: Secondary | ICD-10-CM | POA: Diagnosis not present

## 2020-05-07 DIAGNOSIS — M9902 Segmental and somatic dysfunction of thoracic region: Secondary | ICD-10-CM | POA: Diagnosis not present

## 2020-05-07 DIAGNOSIS — M9901 Segmental and somatic dysfunction of cervical region: Secondary | ICD-10-CM | POA: Diagnosis not present

## 2020-05-07 DIAGNOSIS — M47814 Spondylosis without myelopathy or radiculopathy, thoracic region: Secondary | ICD-10-CM | POA: Diagnosis not present

## 2020-05-07 DIAGNOSIS — M4003 Postural kyphosis, cervicothoracic region: Secondary | ICD-10-CM | POA: Diagnosis not present

## 2020-05-10 NOTE — Progress Notes (Signed)
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  CT Chest 04/28/2020: No evidence of thoracic metastatic disease.    MRI: small soft tissue mass near the left knee with histologically nonspecific features.  Biopsy: Left Knee soft tissue Mass 03/15/2020   Past/Anticipated interventions by radiation oncology, if any: Dr. Marcie Bal [facility]) 04/28/2020 -He has discussed with Dr. Sherlynn Stalls (surgical oncology) in order to provide a multidisciplinary approach. -Given the histology, grade, and location we feel that he would benefit from preoperative course of radiotherapy to improve local control. -We discussed the logistics of treatment. -He would prefer to be treated closer to home.   Past/Anticipated chemotherapy by medical oncology, if any:   Patient's main complaints related to symptomatic tumor(s) are:   Pain on a scale of 0-10 is: 0   Ambulatory status? Walker? Wheelchair?: Ambulatory  SAFETY ISSUES:  Prior radiation? No  Pacemaker/ICD? No  Possible current pregnancy? n/a  Is the patient on methotrexate? no  Additional Complaints / other details:

## 2020-05-11 ENCOUNTER — Other Ambulatory Visit: Payer: Self-pay

## 2020-05-11 ENCOUNTER — Ambulatory Visit
Admission: RE | Admit: 2020-05-11 | Discharge: 2020-05-11 | Disposition: A | Discharge status: Home / Self Care | Payer: BC Managed Care – PPO | Source: Ambulatory Visit | Attending: Radiation Oncology | Admitting: Radiation Oncology

## 2020-05-11 ENCOUNTER — Encounter: Payer: Self-pay | Admitting: Radiation Oncology

## 2020-05-11 VITALS — Ht 68.0 in | Wt 250.0 lb

## 2020-05-11 DIAGNOSIS — D219 Benign neoplasm of connective and other soft tissue, unspecified: Secondary | ICD-10-CM | POA: Insufficient documentation

## 2020-05-11 DIAGNOSIS — R2242 Localized swelling, mass and lump, left lower limb: Secondary | ICD-10-CM

## 2020-05-11 DIAGNOSIS — C499 Malignant neoplasm of connective and soft tissue, unspecified: Secondary | ICD-10-CM

## 2020-05-11 DIAGNOSIS — C4922 Malignant neoplasm of connective and soft tissue of left lower limb, including hip: Secondary | ICD-10-CM | POA: Diagnosis not present

## 2020-05-11 NOTE — Addendum Note (Signed)
Encounter addended by: Cori Razor, RN on: 05/11/2020 12:41 PM  Actions taken: Immunization record saved

## 2020-05-11 NOTE — Progress Notes (Signed)
Radiation Oncology         [phone] ________________________________  Initial Outpatient Consultation - Conducted via telephone due to current COVID-19 concerns for limiting patient exposure  I spoke with the patient to conduct this consult visit via telephone to spare the patient unnecessary potential exposure in the healthcare setting during the current COVID-19 pandemic. The patient was notified in advance and was offered a [place] meeting to allow for face to face communication but unfortunately reported that they did not have the appropriate resources/technology to support such a visit and instead preferred to proceed with a telephone consult.   Name: Angel Bell        MRN: [id number]  Date of Service: 05/11/2020 DOB: 04-02-1976  IW:OEHOZYY, Dibas, MD  Marcie Bal, MD     REFERRING PHYSICIAN: Marcie Bal, MD   DIAGNOSIS: The primary encounter diagnosis was Myxofibroma. Diagnoses of Mass of left knee and Primary myxofibrosarcoma ([place]) were also pertinent to this visit.   HISTORY OF PRESENT ILLNESS: Angel Bell is a 45 y.o. male seen at the request of Dr. Myriam Jacobson at [facility] in radiation oncology. The patient was recently noted to have a mass within his left knee that has been present for many years.  He did not recall any apparent trauma or inciting event and noted that this has been here for several years but recently was evaluated by his primary care provider after stiffness one one occasion. He was referred for MRI which revealed a soft tissue mass and an incidentally noted intraosseous lesion of the distal femur.  The films were not available for review at this time but were performed at [place].  He underwent a core biopsy of this on 03/15/2020 which revealed a grade 2-3 myxofibrosarcoma.  He was seen at [facility] by Dr. Sherlynn Stalls who recommends surgical resection following preoperative radiotherapy.  He has also undergone additional systemic work-up and does not  appear to have metastatic disease. He met with Dr. Myriam Jacobson who recommended 5 weeks of neoadjuvant radiotherapy prior to surgical resection.  The patient is contacted today in order to receive treatment closer to home.    PREVIOUS RADIATION THERAPY: No   PAST MEDICAL HISTORY:  Past Medical History:  Diagnosis Date  . Anxiety   . Depression        PAST SURGICAL HISTORY: Past Surgical History:  Procedure Laterality Date  . APPENDECTOMY    . WISDOM TOOTH EXTRACTION       FAMILY HISTORY:  Family History  Problem Relation Age of Onset  . Depression Mother      SOCIAL HISTORY:  reports that he quit smoking about 11 years ago. His smoking use included cigarettes. He smoked 0.50 packs per day. He has never used smokeless tobacco. He reports current alcohol use of about 5.0 standard drinks of alcohol per week. He reports that he does not use drugs. The patient is single and lives in [place]. 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    ALLERGIES: Patient has no known allergies.   MEDICATIONS:  Current Outpatient Medications  Medication Sig Dispense Refill  . alclomethasone (ACLOVATE) 0.05 % cream APPLY TO AFFECTED AREA EVERY DAY AS NEEDED  0  . atorvastatin (LIPITOR) 20 MG tablet Take 20 mg by mouth daily.  3  . BIOGAIA PROBIOTIC (BIOGAIA PROBIOTIC) LIQD Take by mouth daily at 8 pm.    .  cetirizine (ZYRTEC) 10 MG tablet Take 10 mg by mouth daily.    . Cholecalciferol (VITAMIN D3) 1000 units CAPS Take by mouth.    . Cyanocobalamin (VITAMIN B 12 PO) Take by mouth.    . cycloSPORINE (RESTASIS) 0.05 % ophthalmic emulsion 1 drop 2 (two) times daily.    Marland Kitchen ezetimibe-simvastatin (VYTORIN) 10-10 MG tablet Take 1 tablet by mouth at bedtime.    . Flaxseed, Linseed, (FLAXSEED OIL) 1000 MG CAPS Take by mouth.    . gabapentin (NEURONTIN) 300 MG capsule Take 2 capsules (600 mg total) by mouth at bedtime.  180 capsule 1  . hydrocortisone 2.5 % cream     . irbesartan (AVAPRO) 75 MG tablet Take 75 mg daily by mouth.  3  . Multiple Vitamin (MULTIVITAMIN) tablet Take 1 tablet by mouth daily.    . Omega-3 Fatty Acids (FISH OIL) 1000 MG CPDR Take by mouth.    . pentosan polysulfate (ELMIRON) 100 MG capsule Take 100 mg by mouth 3 (three) times daily.    Marland Kitchen triamterene-hydrochlorothiazide (DYAZIDE) 37.5-25 MG capsule Take 1 capsule by mouth daily.    Marland Kitchen triamterene-hydrochlorothiazide (MAXZIDE-25) 37.5-25 MG tablet TAKE 1 TABLET BY MOUTH EVERY DAY IN THE MORNING  1   No current facility-administered medications for this encounter.     REVIEW OF SYSTEMS: On review of systems, the patient reports that he is doing okay.  He denies any pain in his left knee but states that it is continuously been swollen, he denies any pain or limited range of motion at this time though this is how it did get his attention in December which prompted further evaluation.  No other complaints are verbalized.    PHYSICAL EXAM:  Wt Readings from Last 3 Encounters:  05/11/20 250 lb (113.4 kg)  08/13/18 250 lb (113.4 kg)  11/20/17 250 lb (113.4 kg)   Unable to assess due to encounter type.  ECOG = 0  0 - Asymptomatic (Fully active, able to carry on all predisease activities without restriction)  1 - Symptomatic but completely ambulatory (Restricted in physically strenuous activity but ambulatory and able to carry out work of a light or sedentary nature. For example, light housework, office work)  2 - Symptomatic, <50% in bed during the day (Ambulatory and capable of all self care but unable to carry out any work activities. Up and about more than 50% of waking hours)  3 - Symptomatic, >50% in bed, but not bedbound (Capable of only limited self-care, confined to bed or chair 50% or more of waking hours)  4 - Bedbound (Completely disabled. Cannot carry on any self-care. Totally confined to bed or chair)  5 - Death    Eustace Pen MM, Creech RH, Tormey DC, et al. [phone]). "Toxicity and response criteria of the [facility]". [place] Oncol. 5 (6): 649-55    LABORATORY DATA:  No results found for: WBC, HGB, HCT, MCV, PLT No results found for: NA, K, CL, CO2 No results found for: ALT, AST, GGT, ALKPHOS, BILITOT    RADIOGRAPHY: No results found.     IMPRESSION/PLAN: 1. Myxofibrosarcoma of the left knee. Dr. Lisbeth Renshaw discusses the pathology findings and reviews the nature of sarcomatous disease within the extremity.  He recommends proceeding with neoadjuvant radiotherapy followed by surgical resection we discussed the risks, benefits, short, and long term effects of radiotherapy, as well as the curative intent, and the patient is interested in proceeding. Dr. Lisbeth Renshaw discusses the delivery and logistics of radiotherapy  and anticipates a course of 5 weeks of radiotherapy to the left knee.  He will come in on Friday morning for simulation at 8 AM and will bring his MRI films so we can scan these in for fusion. He will start treatment the week of 05/24/2020, we anticipate that his treatment would complete on 06/25/2020.  We communicate this with Dr. Sherlynn Stalls at [facility] as well.   Given current concerns for patient exposure during the COVID-19 pandemic, this encounter was conducted via telephone.  The patient has provided two factor identification and has given verbal consent for this type of encounter and has been advised to only accept a meeting of this type in a secure network environment. The time spent during this encounter was 60 minutes including preparation, discussion, and coordination of the patient's care. The attendants for this meeting include Blenda Nicely, RN, Dr. Lisbeth Renshaw, Hayden Pedro  and Hilario Quarry.  During the encounter,  Blenda Nicely, RN, Dr. Lisbeth Renshaw, and Hayden Pedro were located at [facility] Radiation Oncology Department.  Hilario Quarry was  located at work   The above documentation reflects my direct findings during this shared patient visit. Please see the separate note by Dr. Lisbeth Renshaw on this date for the remainder of the patient's plan of care.    Carola Rhine, PAC

## 2020-05-12 ENCOUNTER — Encounter: Payer: Self-pay | Admitting: Licensed Clinical Social Worker

## 2020-05-12 NOTE — Progress Notes (Signed)
[place] Psychosocial Distress Screening Clinical Social Work  Clinical Social Work was referred by distress screening protocol.  The patient scored a 6 on the Psychosocial Distress Thermometer which indicates moderate distress. Clinical Social Worker contacted patient by phone to assess for distress and other psychosocial needs.  Patient expressed that he has support from his parents and brother in [place] and some friends locally. Work has been flexible and understanding so far. No resource (food, transportation, housing) needs at this time and does not anticipate any.  CSW informed patient of the support team and support services at [facility].  CSW provided contact information and encouraged patient to call with any questions or concerns.  ON[facility]N DISTRESS SCREENING 05/11/2020  Screening Type Initial Screening  Distress experienced in past week (1-10) 6  Practical problem type Work/school  Emotional problem type Nervousness/Anxiety;Adjusting to illness;Depression    Clinical Social Worker follow up needed: No.  If yes, follow up plan:  MICHELLE E ZAVALA, LCSW

## 2020-05-14 ENCOUNTER — Other Ambulatory Visit: Payer: Self-pay

## 2020-05-14 ENCOUNTER — Ambulatory Visit
Admission: RE | Admit: 2020-05-14 | Discharge: 2020-05-14 | Disposition: A | Discharge status: Home / Self Care | Payer: BC Managed Care – PPO | Source: Ambulatory Visit | Attending: Radiation Oncology | Admitting: Radiation Oncology

## 2020-05-14 DIAGNOSIS — C499 Malignant neoplasm of connective and soft tissue, unspecified: Secondary | ICD-10-CM | POA: Insufficient documentation

## 2020-05-14 DIAGNOSIS — C4922 Malignant neoplasm of connective and soft tissue of left lower limb, including hip: Secondary | ICD-10-CM | POA: Diagnosis not present

## 2020-05-14 DIAGNOSIS — Z510 Encounter for antineoplastic radiation therapy: Secondary | ICD-10-CM | POA: Insufficient documentation

## 2020-05-23 DIAGNOSIS — C4922 Malignant neoplasm of connective and soft tissue of left lower limb, including hip: Secondary | ICD-10-CM | POA: Diagnosis not present

## 2020-05-23 DIAGNOSIS — C499 Malignant neoplasm of connective and soft tissue, unspecified: Secondary | ICD-10-CM | POA: Diagnosis not present

## 2020-05-23 DIAGNOSIS — Z510 Encounter for antineoplastic radiation therapy: Secondary | ICD-10-CM | POA: Diagnosis not present

## 2020-05-24 ENCOUNTER — Other Ambulatory Visit: Payer: Self-pay

## 2020-05-24 ENCOUNTER — Ambulatory Visit
Admission: RE | Admit: 2020-05-24 | Discharge: 2020-05-24 | Disposition: A | Discharge status: Home / Self Care | Payer: BC Managed Care – PPO | Source: Ambulatory Visit | Attending: Radiation Oncology | Admitting: Radiation Oncology

## 2020-05-24 DIAGNOSIS — C4922 Malignant neoplasm of connective and soft tissue of left lower limb, including hip: Secondary | ICD-10-CM | POA: Diagnosis not present

## 2020-05-24 DIAGNOSIS — C499 Malignant neoplasm of connective and soft tissue, unspecified: Secondary | ICD-10-CM | POA: Diagnosis not present

## 2020-05-24 DIAGNOSIS — Z510 Encounter for antineoplastic radiation therapy: Secondary | ICD-10-CM | POA: Diagnosis not present

## 2020-05-25 ENCOUNTER — Ambulatory Visit
Admission: RE | Admit: 2020-05-25 | Discharge: 2020-05-25 | Disposition: A | Discharge status: Home / Self Care | Payer: BC Managed Care – PPO | Source: Ambulatory Visit | Attending: Radiation Oncology | Admitting: Radiation Oncology

## 2020-05-25 DIAGNOSIS — C4922 Malignant neoplasm of connective and soft tissue of left lower limb, including hip: Secondary | ICD-10-CM | POA: Diagnosis not present

## 2020-05-25 DIAGNOSIS — Z510 Encounter for antineoplastic radiation therapy: Secondary | ICD-10-CM | POA: Diagnosis not present

## 2020-05-25 DIAGNOSIS — C499 Malignant neoplasm of connective and soft tissue, unspecified: Secondary | ICD-10-CM | POA: Diagnosis not present

## 2020-05-26 ENCOUNTER — Other Ambulatory Visit: Payer: Self-pay

## 2020-05-26 ENCOUNTER — Ambulatory Visit
Admission: RE | Admit: 2020-05-26 | Discharge: 2020-05-26 | Disposition: A | Discharge status: Home / Self Care | Payer: BC Managed Care – PPO | Source: Ambulatory Visit | Attending: Radiation Oncology | Admitting: Radiation Oncology

## 2020-05-26 DIAGNOSIS — C4922 Malignant neoplasm of connective and soft tissue of left lower limb, including hip: Secondary | ICD-10-CM | POA: Diagnosis not present

## 2020-05-26 DIAGNOSIS — Z510 Encounter for antineoplastic radiation therapy: Secondary | ICD-10-CM | POA: Diagnosis not present

## 2020-05-26 DIAGNOSIS — C499 Malignant neoplasm of connective and soft tissue, unspecified: Secondary | ICD-10-CM | POA: Diagnosis not present

## 2020-05-27 ENCOUNTER — Ambulatory Visit
Admission: RE | Admit: 2020-05-27 | Discharge: 2020-05-27 | Disposition: A | Discharge status: Home / Self Care | Payer: BC Managed Care – PPO | Source: Ambulatory Visit | Attending: Radiation Oncology | Admitting: Radiation Oncology

## 2020-05-27 ENCOUNTER — Other Ambulatory Visit: Payer: Self-pay

## 2020-05-27 DIAGNOSIS — F5101 Primary insomnia: Secondary | ICD-10-CM | POA: Diagnosis not present

## 2020-05-27 DIAGNOSIS — C4922 Malignant neoplasm of connective and soft tissue of left lower limb, including hip: Secondary | ICD-10-CM | POA: Diagnosis not present

## 2020-05-27 DIAGNOSIS — Z0000 Encounter for general adult medical examination without abnormal findings: Secondary | ICD-10-CM | POA: Diagnosis not present

## 2020-05-27 DIAGNOSIS — Z510 Encounter for antineoplastic radiation therapy: Secondary | ICD-10-CM | POA: Diagnosis not present

## 2020-05-27 DIAGNOSIS — C499 Malignant neoplasm of connective and soft tissue, unspecified: Secondary | ICD-10-CM | POA: Diagnosis not present

## 2020-05-27 DIAGNOSIS — I10 Essential (primary) hypertension: Secondary | ICD-10-CM | POA: Diagnosis not present

## 2020-05-27 DIAGNOSIS — E7800 Pure hypercholesterolemia, unspecified: Secondary | ICD-10-CM | POA: Diagnosis not present

## 2020-05-28 ENCOUNTER — Other Ambulatory Visit: Payer: Self-pay

## 2020-05-28 ENCOUNTER — Ambulatory Visit
Admission: RE | Admit: 2020-05-28 | Discharge: 2020-05-28 | Disposition: A | Discharge status: Home / Self Care | Payer: BC Managed Care – PPO | Source: Ambulatory Visit | Attending: Radiation Oncology | Admitting: Radiation Oncology

## 2020-05-28 DIAGNOSIS — C4922 Malignant neoplasm of connective and soft tissue of left lower limb, including hip: Secondary | ICD-10-CM | POA: Diagnosis not present

## 2020-05-28 DIAGNOSIS — C499 Malignant neoplasm of connective and soft tissue, unspecified: Secondary | ICD-10-CM | POA: Diagnosis not present

## 2020-05-28 DIAGNOSIS — Z510 Encounter for antineoplastic radiation therapy: Secondary | ICD-10-CM | POA: Diagnosis not present

## 2020-05-31 ENCOUNTER — Ambulatory Visit
Admission: RE | Admit: 2020-05-31 | Discharge: 2020-05-31 | Disposition: A | Discharge status: Home / Self Care | Payer: BC Managed Care – PPO | Source: Ambulatory Visit | Attending: Radiation Oncology | Admitting: Radiation Oncology

## 2020-05-31 DIAGNOSIS — C4922 Malignant neoplasm of connective and soft tissue of left lower limb, including hip: Secondary | ICD-10-CM | POA: Diagnosis not present

## 2020-05-31 DIAGNOSIS — C499 Malignant neoplasm of connective and soft tissue, unspecified: Secondary | ICD-10-CM | POA: Diagnosis not present

## 2020-05-31 DIAGNOSIS — Z510 Encounter for antineoplastic radiation therapy: Secondary | ICD-10-CM | POA: Diagnosis not present

## 2020-06-01 ENCOUNTER — Ambulatory Visit
Admission: RE | Admit: 2020-06-01 | Discharge: 2020-06-01 | Disposition: A | Discharge status: Home / Self Care | Payer: BC Managed Care – PPO | Source: Ambulatory Visit | Attending: Radiation Oncology | Admitting: Radiation Oncology

## 2020-06-01 ENCOUNTER — Other Ambulatory Visit: Payer: Self-pay

## 2020-06-01 DIAGNOSIS — C4922 Malignant neoplasm of connective and soft tissue of left lower limb, including hip: Secondary | ICD-10-CM | POA: Diagnosis not present

## 2020-06-01 DIAGNOSIS — C499 Malignant neoplasm of connective and soft tissue, unspecified: Secondary | ICD-10-CM | POA: Insufficient documentation

## 2020-06-01 DIAGNOSIS — Z510 Encounter for antineoplastic radiation therapy: Secondary | ICD-10-CM | POA: Diagnosis not present

## 2020-06-02 ENCOUNTER — Ambulatory Visit
Admission: RE | Admit: 2020-06-02 | Discharge: 2020-06-02 | Disposition: A | Discharge status: Home / Self Care | Payer: BC Managed Care – PPO | Source: Ambulatory Visit | Attending: Radiation Oncology | Admitting: Radiation Oncology

## 2020-06-02 ENCOUNTER — Other Ambulatory Visit: Payer: Self-pay

## 2020-06-02 DIAGNOSIS — C4922 Malignant neoplasm of connective and soft tissue of left lower limb, including hip: Secondary | ICD-10-CM | POA: Diagnosis not present

## 2020-06-02 DIAGNOSIS — C499 Malignant neoplasm of connective and soft tissue, unspecified: Secondary | ICD-10-CM | POA: Diagnosis not present

## 2020-06-02 DIAGNOSIS — Z510 Encounter for antineoplastic radiation therapy: Secondary | ICD-10-CM | POA: Diagnosis not present

## 2020-06-03 ENCOUNTER — Other Ambulatory Visit: Payer: Self-pay

## 2020-06-03 ENCOUNTER — Ambulatory Visit
Admission: RE | Admit: 2020-06-03 | Discharge: 2020-06-03 | Disposition: A | Discharge status: Home / Self Care | Payer: BC Managed Care – PPO | Source: Ambulatory Visit | Attending: Radiation Oncology | Admitting: Radiation Oncology

## 2020-06-03 DIAGNOSIS — C499 Malignant neoplasm of connective and soft tissue, unspecified: Secondary | ICD-10-CM | POA: Diagnosis not present

## 2020-06-03 DIAGNOSIS — Z510 Encounter for antineoplastic radiation therapy: Secondary | ICD-10-CM | POA: Diagnosis not present

## 2020-06-03 DIAGNOSIS — C4922 Malignant neoplasm of connective and soft tissue of left lower limb, including hip: Secondary | ICD-10-CM | POA: Diagnosis not present

## 2020-06-04 ENCOUNTER — Ambulatory Visit
Admission: RE | Admit: 2020-06-04 | Discharge: 2020-06-04 | Disposition: A | Discharge status: Home / Self Care | Payer: BC Managed Care – PPO | Source: Ambulatory Visit | Attending: Radiation Oncology | Admitting: Radiation Oncology

## 2020-06-04 ENCOUNTER — Other Ambulatory Visit: Payer: Self-pay

## 2020-06-04 DIAGNOSIS — C499 Malignant neoplasm of connective and soft tissue, unspecified: Secondary | ICD-10-CM | POA: Diagnosis not present

## 2020-06-04 DIAGNOSIS — M9902 Segmental and somatic dysfunction of thoracic region: Secondary | ICD-10-CM | POA: Diagnosis not present

## 2020-06-04 DIAGNOSIS — M4003 Postural kyphosis, cervicothoracic region: Secondary | ICD-10-CM | POA: Diagnosis not present

## 2020-06-04 DIAGNOSIS — Z510 Encounter for antineoplastic radiation therapy: Secondary | ICD-10-CM | POA: Diagnosis not present

## 2020-06-04 DIAGNOSIS — M47814 Spondylosis without myelopathy or radiculopathy, thoracic region: Secondary | ICD-10-CM | POA: Diagnosis not present

## 2020-06-04 DIAGNOSIS — M9901 Segmental and somatic dysfunction of cervical region: Secondary | ICD-10-CM | POA: Diagnosis not present

## 2020-06-04 DIAGNOSIS — C4922 Malignant neoplasm of connective and soft tissue of left lower limb, including hip: Secondary | ICD-10-CM | POA: Diagnosis not present

## 2020-06-07 ENCOUNTER — Other Ambulatory Visit: Payer: Self-pay

## 2020-06-07 ENCOUNTER — Ambulatory Visit
Admission: RE | Admit: 2020-06-07 | Discharge: 2020-06-07 | Disposition: A | Discharge status: Home / Self Care | Payer: BC Managed Care – PPO | Source: Ambulatory Visit | Attending: Radiation Oncology | Admitting: Radiation Oncology

## 2020-06-07 DIAGNOSIS — C499 Malignant neoplasm of connective and soft tissue, unspecified: Secondary | ICD-10-CM | POA: Diagnosis not present

## 2020-06-07 DIAGNOSIS — Z510 Encounter for antineoplastic radiation therapy: Secondary | ICD-10-CM | POA: Diagnosis not present

## 2020-06-07 DIAGNOSIS — C4922 Malignant neoplasm of connective and soft tissue of left lower limb, including hip: Secondary | ICD-10-CM | POA: Diagnosis not present

## 2020-06-08 ENCOUNTER — Other Ambulatory Visit: Payer: Self-pay

## 2020-06-08 ENCOUNTER — Ambulatory Visit
Admission: RE | Admit: 2020-06-08 | Discharge: 2020-06-08 | Disposition: A | Discharge status: Home / Self Care | Payer: BC Managed Care – PPO | Source: Ambulatory Visit | Attending: Radiation Oncology | Admitting: Radiation Oncology

## 2020-06-08 DIAGNOSIS — C499 Malignant neoplasm of connective and soft tissue, unspecified: Secondary | ICD-10-CM | POA: Diagnosis not present

## 2020-06-08 DIAGNOSIS — C4922 Malignant neoplasm of connective and soft tissue of left lower limb, including hip: Secondary | ICD-10-CM | POA: Diagnosis not present

## 2020-06-08 DIAGNOSIS — Z510 Encounter for antineoplastic radiation therapy: Secondary | ICD-10-CM | POA: Diagnosis not present

## 2020-06-09 ENCOUNTER — Other Ambulatory Visit: Payer: Self-pay

## 2020-06-09 ENCOUNTER — Ambulatory Visit
Admission: RE | Admit: 2020-06-09 | Discharge: 2020-06-09 | Disposition: A | Discharge status: Home / Self Care | Payer: BC Managed Care – PPO | Source: Ambulatory Visit | Attending: Radiation Oncology | Admitting: Radiation Oncology

## 2020-06-09 DIAGNOSIS — C4922 Malignant neoplasm of connective and soft tissue of left lower limb, including hip: Secondary | ICD-10-CM | POA: Diagnosis not present

## 2020-06-09 DIAGNOSIS — C499 Malignant neoplasm of connective and soft tissue, unspecified: Secondary | ICD-10-CM | POA: Diagnosis not present

## 2020-06-09 DIAGNOSIS — Z510 Encounter for antineoplastic radiation therapy: Secondary | ICD-10-CM | POA: Diagnosis not present

## 2020-06-10 ENCOUNTER — Other Ambulatory Visit: Payer: Self-pay

## 2020-06-10 ENCOUNTER — Ambulatory Visit
Admission: RE | Admit: 2020-06-10 | Discharge: 2020-06-10 | Disposition: A | Discharge status: Home / Self Care | Payer: BC Managed Care – PPO | Source: Ambulatory Visit | Attending: Radiation Oncology | Admitting: Radiation Oncology

## 2020-06-10 DIAGNOSIS — Z510 Encounter for antineoplastic radiation therapy: Secondary | ICD-10-CM | POA: Diagnosis not present

## 2020-06-10 DIAGNOSIS — C4922 Malignant neoplasm of connective and soft tissue of left lower limb, including hip: Secondary | ICD-10-CM | POA: Diagnosis not present

## 2020-06-10 DIAGNOSIS — C499 Malignant neoplasm of connective and soft tissue, unspecified: Secondary | ICD-10-CM | POA: Diagnosis not present

## 2020-06-11 ENCOUNTER — Ambulatory Visit
Admission: RE | Admit: 2020-06-11 | Discharge: 2020-06-11 | Disposition: A | Discharge status: Home / Self Care | Payer: BC Managed Care – PPO | Source: Ambulatory Visit | Attending: Radiation Oncology | Admitting: Radiation Oncology

## 2020-06-11 ENCOUNTER — Other Ambulatory Visit: Payer: Self-pay

## 2020-06-11 DIAGNOSIS — Z510 Encounter for antineoplastic radiation therapy: Secondary | ICD-10-CM | POA: Diagnosis not present

## 2020-06-11 DIAGNOSIS — C499 Malignant neoplasm of connective and soft tissue, unspecified: Secondary | ICD-10-CM | POA: Diagnosis not present

## 2020-06-11 DIAGNOSIS — C4922 Malignant neoplasm of connective and soft tissue of left lower limb, including hip: Secondary | ICD-10-CM | POA: Diagnosis not present

## 2020-06-14 ENCOUNTER — Other Ambulatory Visit: Payer: Self-pay

## 2020-06-14 ENCOUNTER — Ambulatory Visit
Admission: RE | Admit: 2020-06-14 | Discharge: 2020-06-14 | Disposition: A | Discharge status: Home / Self Care | Payer: BC Managed Care – PPO | Source: Ambulatory Visit | Attending: Radiation Oncology | Admitting: Radiation Oncology

## 2020-06-14 DIAGNOSIS — C4922 Malignant neoplasm of connective and soft tissue of left lower limb, including hip: Secondary | ICD-10-CM | POA: Diagnosis not present

## 2020-06-14 DIAGNOSIS — C499 Malignant neoplasm of connective and soft tissue, unspecified: Secondary | ICD-10-CM | POA: Diagnosis not present

## 2020-06-14 DIAGNOSIS — Z510 Encounter for antineoplastic radiation therapy: Secondary | ICD-10-CM | POA: Diagnosis not present

## 2020-06-15 ENCOUNTER — Other Ambulatory Visit: Payer: Self-pay

## 2020-06-15 ENCOUNTER — Ambulatory Visit
Admission: RE | Admit: 2020-06-15 | Discharge: 2020-06-15 | Disposition: A | Discharge status: Home / Self Care | Payer: BC Managed Care – PPO | Source: Ambulatory Visit | Attending: Radiation Oncology | Admitting: Radiation Oncology

## 2020-06-15 DIAGNOSIS — C4922 Malignant neoplasm of connective and soft tissue of left lower limb, including hip: Secondary | ICD-10-CM | POA: Diagnosis not present

## 2020-06-15 DIAGNOSIS — C499 Malignant neoplasm of connective and soft tissue, unspecified: Secondary | ICD-10-CM | POA: Diagnosis not present

## 2020-06-15 DIAGNOSIS — Z510 Encounter for antineoplastic radiation therapy: Secondary | ICD-10-CM | POA: Diagnosis not present

## 2020-06-16 ENCOUNTER — Ambulatory Visit
Admission: RE | Admit: 2020-06-16 | Discharge: 2020-06-16 | Disposition: A | Discharge status: Home / Self Care | Payer: BC Managed Care – PPO | Source: Ambulatory Visit | Attending: Radiation Oncology | Admitting: Radiation Oncology

## 2020-06-16 ENCOUNTER — Other Ambulatory Visit: Payer: Self-pay

## 2020-06-16 DIAGNOSIS — Z510 Encounter for antineoplastic radiation therapy: Secondary | ICD-10-CM | POA: Diagnosis not present

## 2020-06-16 DIAGNOSIS — C499 Malignant neoplasm of connective and soft tissue, unspecified: Secondary | ICD-10-CM | POA: Diagnosis not present

## 2020-06-16 DIAGNOSIS — C4922 Malignant neoplasm of connective and soft tissue of left lower limb, including hip: Secondary | ICD-10-CM | POA: Diagnosis not present

## 2020-06-17 ENCOUNTER — Ambulatory Visit
Admission: RE | Admit: 2020-06-17 | Discharge: 2020-06-17 | Disposition: A | Discharge status: Home / Self Care | Payer: BC Managed Care – PPO | Source: Ambulatory Visit | Attending: Radiation Oncology | Admitting: Radiation Oncology

## 2020-06-17 DIAGNOSIS — C499 Malignant neoplasm of connective and soft tissue, unspecified: Secondary | ICD-10-CM | POA: Diagnosis not present

## 2020-06-17 DIAGNOSIS — Z510 Encounter for antineoplastic radiation therapy: Secondary | ICD-10-CM | POA: Diagnosis not present

## 2020-06-17 DIAGNOSIS — C4922 Malignant neoplasm of connective and soft tissue of left lower limb, including hip: Secondary | ICD-10-CM | POA: Diagnosis not present

## 2020-06-18 ENCOUNTER — Other Ambulatory Visit: Payer: Self-pay

## 2020-06-18 ENCOUNTER — Ambulatory Visit
Admission: RE | Admit: 2020-06-18 | Discharge: 2020-06-18 | Disposition: A | Discharge status: Home / Self Care | Payer: BC Managed Care – PPO | Source: Ambulatory Visit | Attending: Radiation Oncology | Admitting: Radiation Oncology

## 2020-06-18 DIAGNOSIS — C499 Malignant neoplasm of connective and soft tissue, unspecified: Secondary | ICD-10-CM | POA: Diagnosis not present

## 2020-06-18 DIAGNOSIS — C4922 Malignant neoplasm of connective and soft tissue of left lower limb, including hip: Secondary | ICD-10-CM | POA: Diagnosis not present

## 2020-06-18 DIAGNOSIS — Z510 Encounter for antineoplastic radiation therapy: Secondary | ICD-10-CM | POA: Diagnosis not present

## 2020-06-21 ENCOUNTER — Ambulatory Visit
Admission: RE | Admit: 2020-06-21 | Discharge: 2020-06-21 | Disposition: A | Discharge status: Home / Self Care | Payer: BC Managed Care – PPO | Source: Ambulatory Visit | Attending: Radiation Oncology | Admitting: Radiation Oncology

## 2020-06-21 ENCOUNTER — Other Ambulatory Visit: Payer: Self-pay

## 2020-06-21 DIAGNOSIS — C4922 Malignant neoplasm of connective and soft tissue of left lower limb, including hip: Secondary | ICD-10-CM | POA: Diagnosis not present

## 2020-06-21 DIAGNOSIS — I10 Essential (primary) hypertension: Secondary | ICD-10-CM | POA: Diagnosis not present

## 2020-06-21 DIAGNOSIS — C499 Malignant neoplasm of connective and soft tissue, unspecified: Secondary | ICD-10-CM | POA: Diagnosis not present

## 2020-06-21 DIAGNOSIS — Z510 Encounter for antineoplastic radiation therapy: Secondary | ICD-10-CM | POA: Diagnosis not present

## 2020-06-22 ENCOUNTER — Ambulatory Visit
Admission: RE | Admit: 2020-06-22 | Discharge: 2020-06-22 | Disposition: A | Discharge status: Home / Self Care | Payer: BC Managed Care – PPO | Source: Ambulatory Visit | Attending: Radiation Oncology | Admitting: Radiation Oncology

## 2020-06-22 DIAGNOSIS — C499 Malignant neoplasm of connective and soft tissue, unspecified: Secondary | ICD-10-CM | POA: Diagnosis not present

## 2020-06-22 DIAGNOSIS — Z510 Encounter for antineoplastic radiation therapy: Secondary | ICD-10-CM | POA: Diagnosis not present

## 2020-06-22 DIAGNOSIS — C4922 Malignant neoplasm of connective and soft tissue of left lower limb, including hip: Secondary | ICD-10-CM | POA: Diagnosis not present

## 2020-06-23 ENCOUNTER — Ambulatory Visit
Admission: RE | Admit: 2020-06-23 | Discharge: 2020-06-23 | Disposition: A | Discharge status: Home / Self Care | Payer: BC Managed Care – PPO | Source: Ambulatory Visit | Attending: Radiation Oncology | Admitting: Radiation Oncology

## 2020-06-23 ENCOUNTER — Other Ambulatory Visit: Payer: Self-pay

## 2020-06-23 DIAGNOSIS — Z510 Encounter for antineoplastic radiation therapy: Secondary | ICD-10-CM | POA: Diagnosis not present

## 2020-06-23 DIAGNOSIS — C4922 Malignant neoplasm of connective and soft tissue of left lower limb, including hip: Secondary | ICD-10-CM | POA: Diagnosis not present

## 2020-06-23 DIAGNOSIS — C499 Malignant neoplasm of connective and soft tissue, unspecified: Secondary | ICD-10-CM | POA: Diagnosis not present

## 2020-06-24 ENCOUNTER — Ambulatory Visit
Admission: RE | Admit: 2020-06-24 | Discharge: 2020-06-24 | Disposition: A | Discharge status: Home / Self Care | Payer: BC Managed Care – PPO | Source: Ambulatory Visit | Attending: Radiation Oncology | Admitting: Radiation Oncology

## 2020-06-24 DIAGNOSIS — C4922 Malignant neoplasm of connective and soft tissue of left lower limb, including hip: Secondary | ICD-10-CM | POA: Diagnosis not present

## 2020-06-24 DIAGNOSIS — C499 Malignant neoplasm of connective and soft tissue, unspecified: Secondary | ICD-10-CM | POA: Diagnosis not present

## 2020-06-24 DIAGNOSIS — Z510 Encounter for antineoplastic radiation therapy: Secondary | ICD-10-CM | POA: Diagnosis not present

## 2020-06-25 ENCOUNTER — Other Ambulatory Visit: Payer: Self-pay

## 2020-06-25 ENCOUNTER — Ambulatory Visit
Admission: RE | Admit: 2020-06-25 | Discharge: 2020-06-25 | Disposition: A | Discharge status: Home / Self Care | Payer: BC Managed Care – PPO | Source: Ambulatory Visit | Attending: Radiation Oncology | Admitting: Radiation Oncology

## 2020-06-25 ENCOUNTER — Encounter: Payer: Self-pay | Admitting: Radiation Oncology

## 2020-06-25 DIAGNOSIS — C4922 Malignant neoplasm of connective and soft tissue of left lower limb, including hip: Secondary | ICD-10-CM | POA: Diagnosis not present

## 2020-06-25 DIAGNOSIS — C499 Malignant neoplasm of connective and soft tissue, unspecified: Secondary | ICD-10-CM | POA: Diagnosis not present

## 2020-06-25 DIAGNOSIS — Z510 Encounter for antineoplastic radiation therapy: Secondary | ICD-10-CM | POA: Diagnosis not present

## 2020-06-28 NOTE — Progress Notes (Signed)
  Patient Name: Angel Bell MRN: [id number] DOB: 1976/01/04 Referring Physician: Lujean Amel (Profile Not Attached) Date of Service: 06/25/2020 [facility], [place]                                                        End Of Treatment Note  Diagnoses: C49.9-Malignant neoplasm of connective and soft tissue, unspecified  Cancer Staging: Myxofibrosarcoma of the left knee.  Intent: Palliative  Radiation Treatment Dates: 05/24/2020 through 06/25/2020 Site Technique Total Dose (Gy) Dose per Fx (Gy) Completed Fx Beam Energies  Femur Left: Ext_Lt IMRT 50/50 2 25/25 6X   Narrative: The patient tolerated radiation therapy relatively well. He did note fatigue, as well as anticipated skin changes and tenderness in the treatment field.  Plan: The patient will receive a call in about one month from the radiation oncology department. He will continue follow up with Dr. Sherlynn Stalls at [facility] as well.  ________________________________________________    Carola Rhine, [facility]

## 2020-07-02 DIAGNOSIS — M47814 Spondylosis without myelopathy or radiculopathy, thoracic region: Secondary | ICD-10-CM | POA: Diagnosis not present

## 2020-07-02 DIAGNOSIS — M9902 Segmental and somatic dysfunction of thoracic region: Secondary | ICD-10-CM | POA: Diagnosis not present

## 2020-07-02 DIAGNOSIS — M4003 Postural kyphosis, cervicothoracic region: Secondary | ICD-10-CM | POA: Diagnosis not present

## 2020-07-02 DIAGNOSIS — M9901 Segmental and somatic dysfunction of cervical region: Secondary | ICD-10-CM | POA: Diagnosis not present

## 2020-07-14 DIAGNOSIS — C499 Malignant neoplasm of connective and soft tissue, unspecified: Secondary | ICD-10-CM | POA: Diagnosis not present

## 2020-07-14 DIAGNOSIS — Z923 Personal history of irradiation: Secondary | ICD-10-CM | POA: Diagnosis not present

## 2020-07-30 DIAGNOSIS — M4003 Postural kyphosis, cervicothoracic region: Secondary | ICD-10-CM | POA: Diagnosis not present

## 2020-07-30 DIAGNOSIS — M9902 Segmental and somatic dysfunction of thoracic region: Secondary | ICD-10-CM | POA: Diagnosis not present

## 2020-07-30 DIAGNOSIS — M9901 Segmental and somatic dysfunction of cervical region: Secondary | ICD-10-CM | POA: Diagnosis not present

## 2020-07-30 DIAGNOSIS — M47814 Spondylosis without myelopathy or radiculopathy, thoracic region: Secondary | ICD-10-CM | POA: Diagnosis not present

## 2020-08-13 DIAGNOSIS — Z20822 Contact with and (suspected) exposure to covid-19: Secondary | ICD-10-CM | POA: Diagnosis not present

## 2020-08-16 DIAGNOSIS — C4922 Malignant neoplasm of connective and soft tissue of left lower limb, including hip: Secondary | ICD-10-CM | POA: Diagnosis not present

## 2020-08-16 DIAGNOSIS — E669 Obesity, unspecified: Secondary | ICD-10-CM | POA: Diagnosis not present

## 2020-08-16 DIAGNOSIS — E785 Hyperlipidemia, unspecified: Secondary | ICD-10-CM | POA: Diagnosis not present

## 2020-08-16 DIAGNOSIS — Z79899 Other long term (current) drug therapy: Secondary | ICD-10-CM | POA: Diagnosis not present

## 2020-08-16 DIAGNOSIS — C499 Malignant neoplasm of connective and soft tissue, unspecified: Secondary | ICD-10-CM | POA: Diagnosis not present

## 2020-08-16 DIAGNOSIS — I10 Essential (primary) hypertension: Secondary | ICD-10-CM | POA: Diagnosis not present

## 2020-09-06 ENCOUNTER — Ambulatory Visit
Admission: RE | Admit: 2020-09-06 | Discharge: 2020-09-06 | Disposition: A | Discharge status: Home / Self Care | Payer: BC Managed Care – PPO | Source: Ambulatory Visit | Attending: Radiation Oncology | Admitting: Radiation Oncology

## 2020-09-06 DIAGNOSIS — D219 Benign neoplasm of connective and other soft tissue, unspecified: Secondary | ICD-10-CM

## 2020-09-06 NOTE — Progress Notes (Signed)
  Radiation Oncology         [phone] ________________________________  Name: Angel Bell MRN: [id number]  Date of Service: 09/06/2020  DOB: 01/05/1976  Post Treatment Telephone Note  Diagnosis:  Myxofibrosarcoma of the left knee.  Interval Since Last Radiation:  12 weeks   05/24/2020 through 06/25/2020 Site Technique Total Dose (Gy) Dose per Fx (Gy) Completed Fx Beam Energies  Femur Left: Ext_Lt IMRT 50/50 2 25/25 6X     Narrative:  The patient was contacted today for routine follow-up. During treatment he did well without significant desquamation. He had surgery on 08/16/20 and has been recovering well. He is waiting to have staples removed and to see plastic surgery. Pain has been very well controlled and he's hopeful to get back to work soon.  Impression/Plan: 1. Myxofibrosarcoma of the left knee. The patient has been doing well since completion of radiotherapy. We discussed that we would be happy to continue to follow him as needed, but she will also continue to follow up with Dr. Sherlynn Stalls at [facility].       Carola Rhine, PAC

## 2020-09-14 DIAGNOSIS — M4003 Postural kyphosis, cervicothoracic region: Secondary | ICD-10-CM | POA: Diagnosis not present

## 2020-09-14 DIAGNOSIS — M47814 Spondylosis without myelopathy or radiculopathy, thoracic region: Secondary | ICD-10-CM | POA: Diagnosis not present

## 2020-09-14 DIAGNOSIS — M9901 Segmental and somatic dysfunction of cervical region: Secondary | ICD-10-CM | POA: Diagnosis not present

## 2020-09-14 DIAGNOSIS — M9902 Segmental and somatic dysfunction of thoracic region: Secondary | ICD-10-CM | POA: Diagnosis not present

## 2020-10-07 DIAGNOSIS — M25562 Pain in left knee: Secondary | ICD-10-CM | POA: Diagnosis not present

## 2020-10-07 DIAGNOSIS — M6281 Muscle weakness (generalized): Secondary | ICD-10-CM | POA: Diagnosis not present

## 2020-10-07 DIAGNOSIS — M2560 Stiffness of unspecified joint, not elsewhere classified: Secondary | ICD-10-CM | POA: Diagnosis not present

## 2020-10-07 DIAGNOSIS — M79662 Pain in left lower leg: Secondary | ICD-10-CM | POA: Diagnosis not present

## 2020-10-12 DIAGNOSIS — M6281 Muscle weakness (generalized): Secondary | ICD-10-CM | POA: Diagnosis not present

## 2020-10-12 DIAGNOSIS — M79662 Pain in left lower leg: Secondary | ICD-10-CM | POA: Diagnosis not present

## 2020-10-12 DIAGNOSIS — M2560 Stiffness of unspecified joint, not elsewhere classified: Secondary | ICD-10-CM | POA: Diagnosis not present

## 2020-10-12 DIAGNOSIS — M25562 Pain in left knee: Secondary | ICD-10-CM | POA: Diagnosis not present

## 2020-10-14 DIAGNOSIS — M6281 Muscle weakness (generalized): Secondary | ICD-10-CM | POA: Diagnosis not present

## 2020-10-14 DIAGNOSIS — M2560 Stiffness of unspecified joint, not elsewhere classified: Secondary | ICD-10-CM | POA: Diagnosis not present

## 2020-10-14 DIAGNOSIS — M25562 Pain in left knee: Secondary | ICD-10-CM | POA: Diagnosis not present

## 2020-10-14 DIAGNOSIS — M79662 Pain in left lower leg: Secondary | ICD-10-CM | POA: Diagnosis not present

## 2020-10-19 DIAGNOSIS — M79662 Pain in left lower leg: Secondary | ICD-10-CM | POA: Diagnosis not present

## 2020-10-19 DIAGNOSIS — M2560 Stiffness of unspecified joint, not elsewhere classified: Secondary | ICD-10-CM | POA: Diagnosis not present

## 2020-10-19 DIAGNOSIS — M6281 Muscle weakness (generalized): Secondary | ICD-10-CM | POA: Diagnosis not present

## 2020-10-19 DIAGNOSIS — M25562 Pain in left knee: Secondary | ICD-10-CM | POA: Diagnosis not present

## 2020-10-20 DIAGNOSIS — M9902 Segmental and somatic dysfunction of thoracic region: Secondary | ICD-10-CM | POA: Diagnosis not present

## 2020-10-20 DIAGNOSIS — M9901 Segmental and somatic dysfunction of cervical region: Secondary | ICD-10-CM | POA: Diagnosis not present

## 2020-10-20 DIAGNOSIS — M47814 Spondylosis without myelopathy or radiculopathy, thoracic region: Secondary | ICD-10-CM | POA: Diagnosis not present

## 2020-10-20 DIAGNOSIS — M4003 Postural kyphosis, cervicothoracic region: Secondary | ICD-10-CM | POA: Diagnosis not present

## 2020-10-21 DIAGNOSIS — L719 Rosacea, unspecified: Secondary | ICD-10-CM | POA: Diagnosis not present

## 2020-10-21 DIAGNOSIS — M6281 Muscle weakness (generalized): Secondary | ICD-10-CM | POA: Diagnosis not present

## 2020-10-21 DIAGNOSIS — M79662 Pain in left lower leg: Secondary | ICD-10-CM | POA: Diagnosis not present

## 2020-10-21 DIAGNOSIS — M25562 Pain in left knee: Secondary | ICD-10-CM | POA: Diagnosis not present

## 2020-10-21 DIAGNOSIS — L578 Other skin changes due to chronic exposure to nonionizing radiation: Secondary | ICD-10-CM | POA: Diagnosis not present

## 2020-10-21 DIAGNOSIS — M2560 Stiffness of unspecified joint, not elsewhere classified: Secondary | ICD-10-CM | POA: Diagnosis not present

## 2020-10-21 DIAGNOSIS — L739 Follicular disorder, unspecified: Secondary | ICD-10-CM | POA: Diagnosis not present

## 2020-10-21 DIAGNOSIS — L408 Other psoriasis: Secondary | ICD-10-CM | POA: Diagnosis not present

## 2020-10-26 DIAGNOSIS — M2560 Stiffness of unspecified joint, not elsewhere classified: Secondary | ICD-10-CM | POA: Diagnosis not present

## 2020-10-26 DIAGNOSIS — M25562 Pain in left knee: Secondary | ICD-10-CM | POA: Diagnosis not present

## 2020-10-26 DIAGNOSIS — M6281 Muscle weakness (generalized): Secondary | ICD-10-CM | POA: Diagnosis not present

## 2020-10-26 DIAGNOSIS — M79662 Pain in left lower leg: Secondary | ICD-10-CM | POA: Diagnosis not present

## 2020-10-28 DIAGNOSIS — M25562 Pain in left knee: Secondary | ICD-10-CM | POA: Diagnosis not present

## 2020-10-28 DIAGNOSIS — M2560 Stiffness of unspecified joint, not elsewhere classified: Secondary | ICD-10-CM | POA: Diagnosis not present

## 2020-10-28 DIAGNOSIS — M79662 Pain in left lower leg: Secondary | ICD-10-CM | POA: Diagnosis not present

## 2020-10-28 DIAGNOSIS — M6281 Muscle weakness (generalized): Secondary | ICD-10-CM | POA: Diagnosis not present

## 2020-11-19 DIAGNOSIS — R361 Hematospermia: Secondary | ICD-10-CM | POA: Diagnosis not present

## 2020-11-22 DIAGNOSIS — R7309 Other abnormal glucose: Secondary | ICD-10-CM | POA: Diagnosis not present

## 2020-11-22 DIAGNOSIS — I10 Essential (primary) hypertension: Secondary | ICD-10-CM | POA: Diagnosis not present

## 2020-12-01 DIAGNOSIS — M9902 Segmental and somatic dysfunction of thoracic region: Secondary | ICD-10-CM | POA: Diagnosis not present

## 2020-12-01 DIAGNOSIS — M4003 Postural kyphosis, cervicothoracic region: Secondary | ICD-10-CM | POA: Diagnosis not present

## 2020-12-01 DIAGNOSIS — M9901 Segmental and somatic dysfunction of cervical region: Secondary | ICD-10-CM | POA: Diagnosis not present

## 2020-12-01 DIAGNOSIS — M47814 Spondylosis without myelopathy or radiculopathy, thoracic region: Secondary | ICD-10-CM | POA: Diagnosis not present

## 2020-12-30 DIAGNOSIS — M9902 Segmental and somatic dysfunction of thoracic region: Secondary | ICD-10-CM | POA: Diagnosis not present

## 2020-12-30 DIAGNOSIS — M9901 Segmental and somatic dysfunction of cervical region: Secondary | ICD-10-CM | POA: Diagnosis not present

## 2020-12-30 DIAGNOSIS — M4003 Postural kyphosis, cervicothoracic region: Secondary | ICD-10-CM | POA: Diagnosis not present

## 2020-12-30 DIAGNOSIS — M47814 Spondylosis without myelopathy or radiculopathy, thoracic region: Secondary | ICD-10-CM | POA: Diagnosis not present

## 2021-01-05 DIAGNOSIS — C499 Malignant neoplasm of connective and soft tissue, unspecified: Secondary | ICD-10-CM | POA: Diagnosis not present

## 2021-01-27 DIAGNOSIS — M9901 Segmental and somatic dysfunction of cervical region: Secondary | ICD-10-CM | POA: Diagnosis not present

## 2021-01-27 DIAGNOSIS — M4003 Postural kyphosis, cervicothoracic region: Secondary | ICD-10-CM | POA: Diagnosis not present

## 2021-01-27 DIAGNOSIS — M9902 Segmental and somatic dysfunction of thoracic region: Secondary | ICD-10-CM | POA: Diagnosis not present

## 2021-01-27 DIAGNOSIS — M47814 Spondylosis without myelopathy or radiculopathy, thoracic region: Secondary | ICD-10-CM | POA: Diagnosis not present

## 2021-02-28 DIAGNOSIS — M9901 Segmental and somatic dysfunction of cervical region: Secondary | ICD-10-CM | POA: Diagnosis not present

## 2021-02-28 DIAGNOSIS — M4003 Postural kyphosis, cervicothoracic region: Secondary | ICD-10-CM | POA: Diagnosis not present

## 2021-02-28 DIAGNOSIS — M9902 Segmental and somatic dysfunction of thoracic region: Secondary | ICD-10-CM | POA: Diagnosis not present

## 2021-02-28 DIAGNOSIS — M47814 Spondylosis without myelopathy or radiculopathy, thoracic region: Secondary | ICD-10-CM | POA: Diagnosis not present

## 2021-03-16 DIAGNOSIS — M9902 Segmental and somatic dysfunction of thoracic region: Secondary | ICD-10-CM | POA: Diagnosis not present

## 2021-03-16 DIAGNOSIS — M9901 Segmental and somatic dysfunction of cervical region: Secondary | ICD-10-CM | POA: Diagnosis not present

## 2021-03-16 DIAGNOSIS — M4003 Postural kyphosis, cervicothoracic region: Secondary | ICD-10-CM | POA: Diagnosis not present

## 2021-03-16 DIAGNOSIS — M47814 Spondylosis without myelopathy or radiculopathy, thoracic region: Secondary | ICD-10-CM | POA: Diagnosis not present

## 2021-08-08 ENCOUNTER — Other Ambulatory Visit: Payer: Self-pay | Admitting: Family Medicine

## 2021-08-08 DIAGNOSIS — R7401 Elevation of levels of liver transaminase levels: Secondary | ICD-10-CM

## 2021-08-24 ENCOUNTER — Other Ambulatory Visit: Payer: BC Managed Care – PPO

## 2021-08-30 ENCOUNTER — Ambulatory Visit
Admission: RE | Admit: 2021-08-30 | Discharge: 2021-08-30 | Disposition: A | Discharge status: Home / Self Care | Payer: 59 | Source: Ambulatory Visit | Attending: Family Medicine | Admitting: Family Medicine

## 2021-08-30 DIAGNOSIS — R7401 Elevation of levels of liver transaminase levels: Secondary | ICD-10-CM

## 2024-03-27 IMAGING — US US ABDOMEN LIMITED
1 series · 14 of 25 positions shown · non-contrast
Comparison: None Available.

CLINICAL DATA: Transaminitis

EXAM:
ULTRASOUND ABDOMEN LIMITED RIGHT UPPER QUADRANT

[Series 1: us abdomen limited · 0.23mm/px · 14 of 61 slices shown]
[im 1/61]
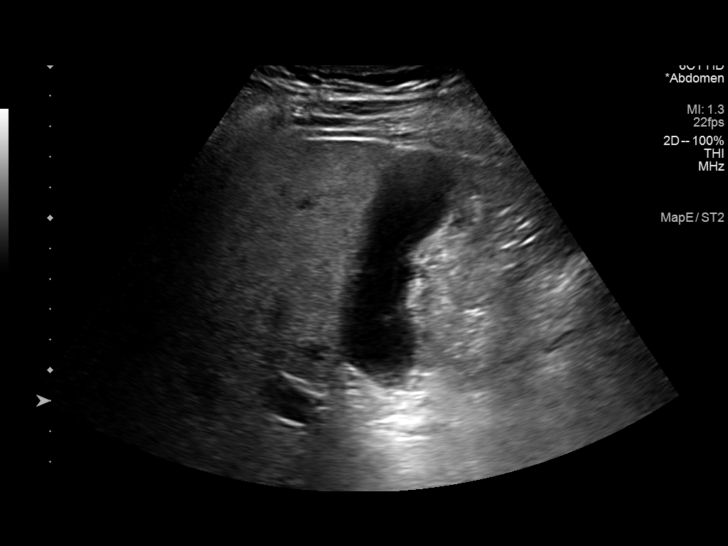
[im 6/61]
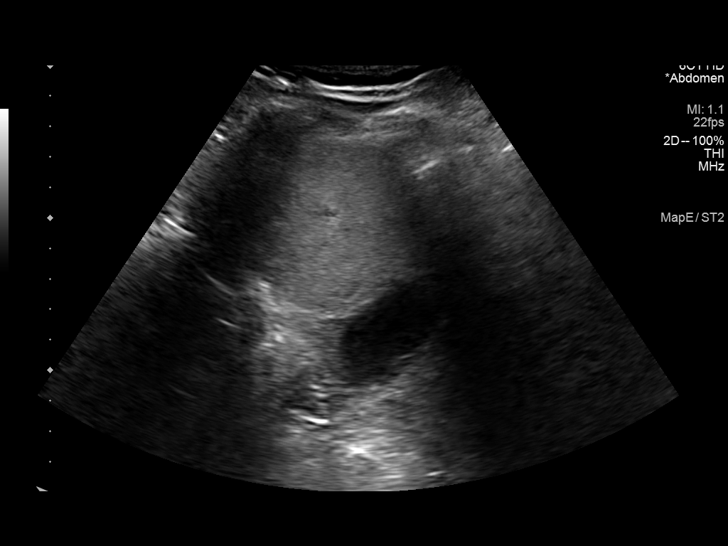
[im 11/61]
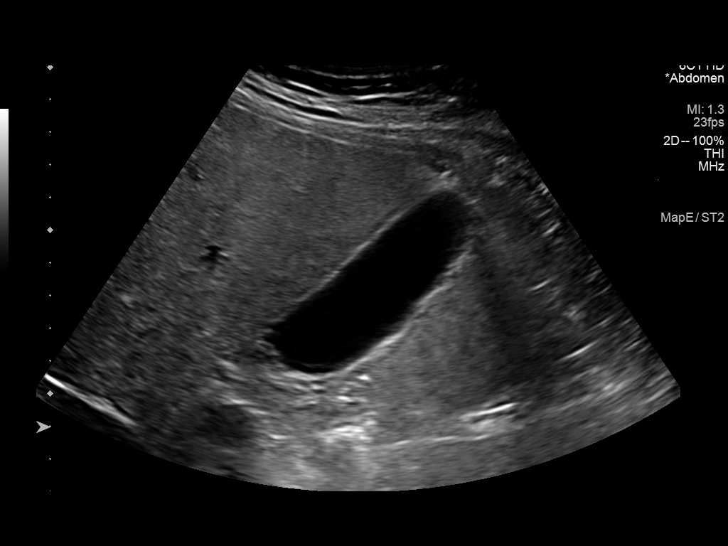
[im 16/61]
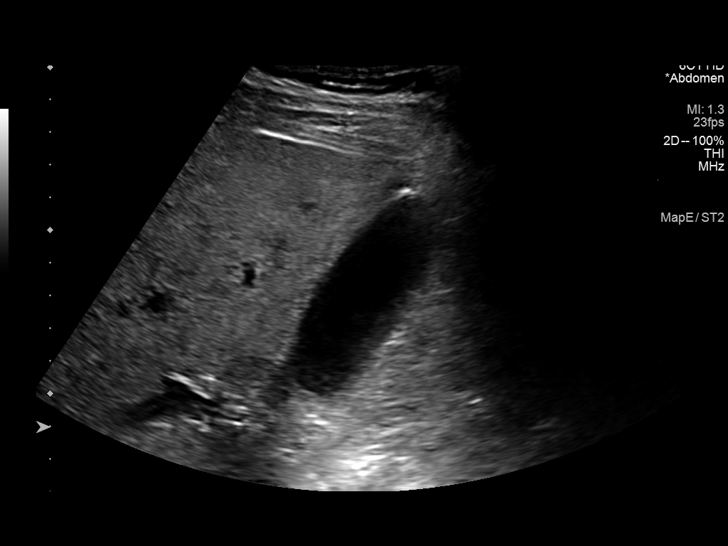
[im 21/61]
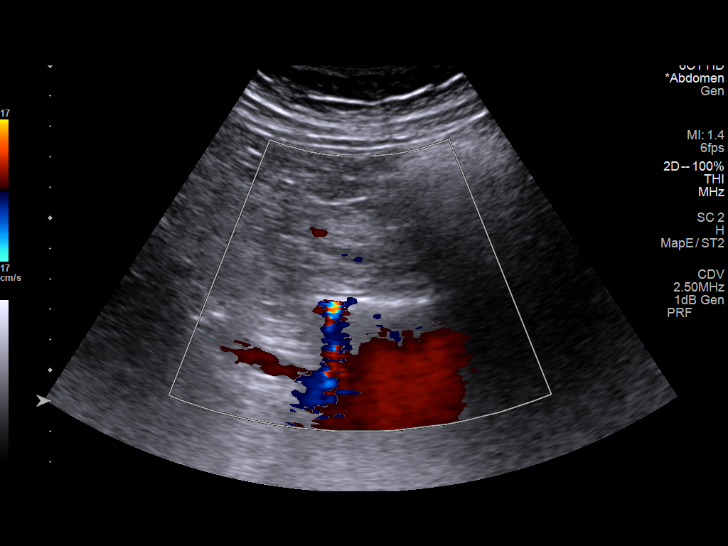
[im 23/61]
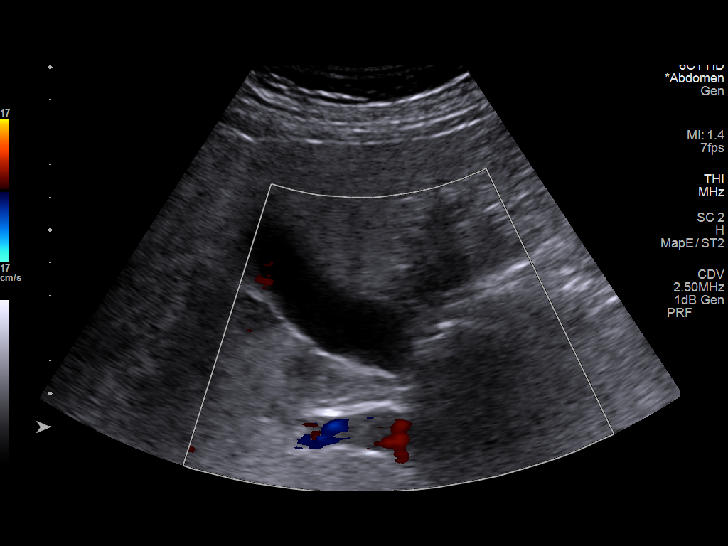
[im 28/61]
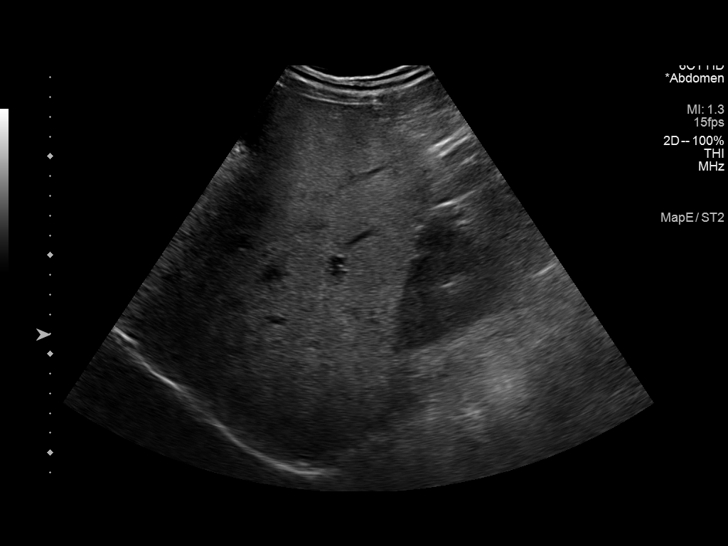
[im 33/61]
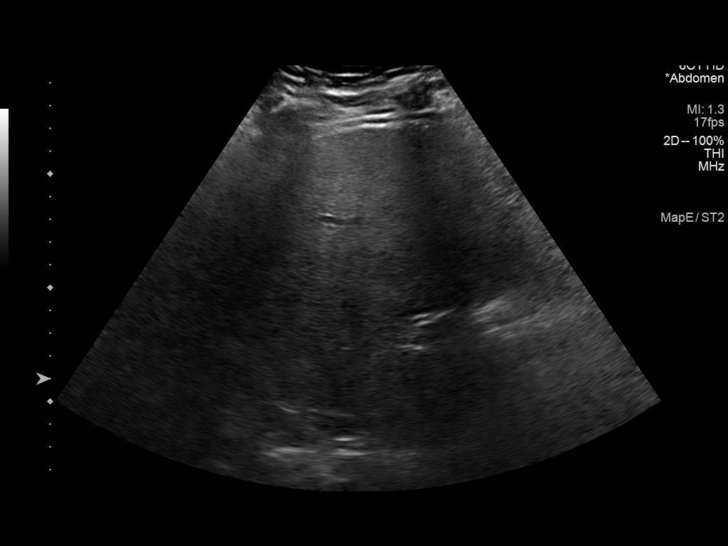
[im 38/61]
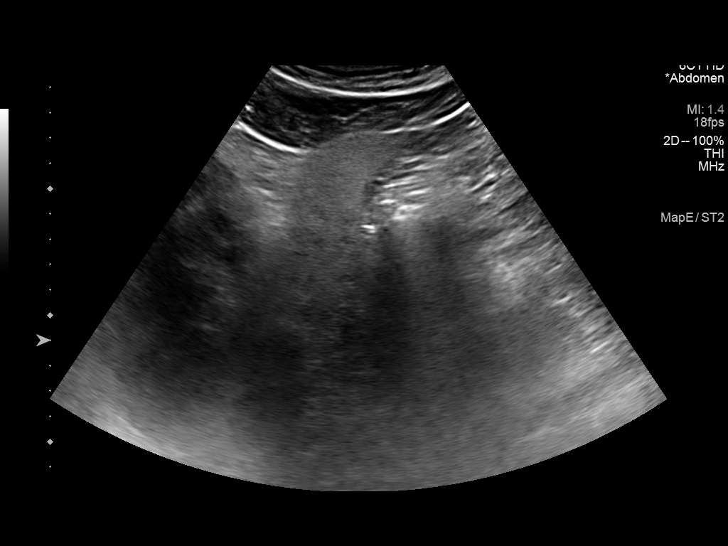
[im 41/61]
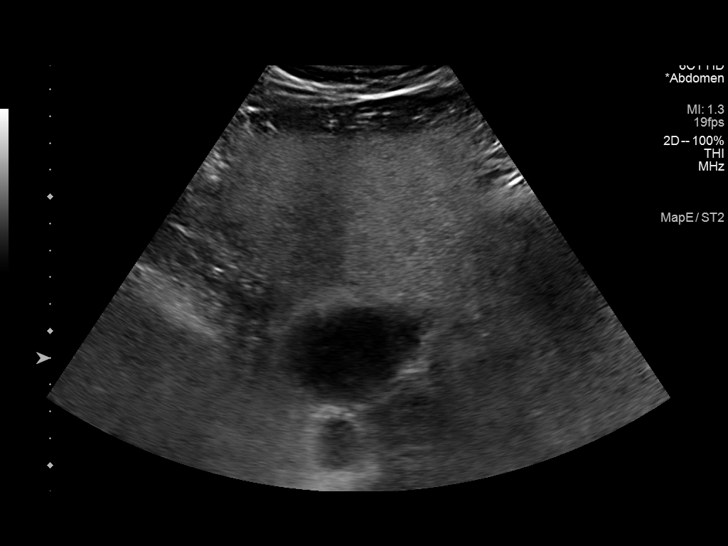
[im 46/61]
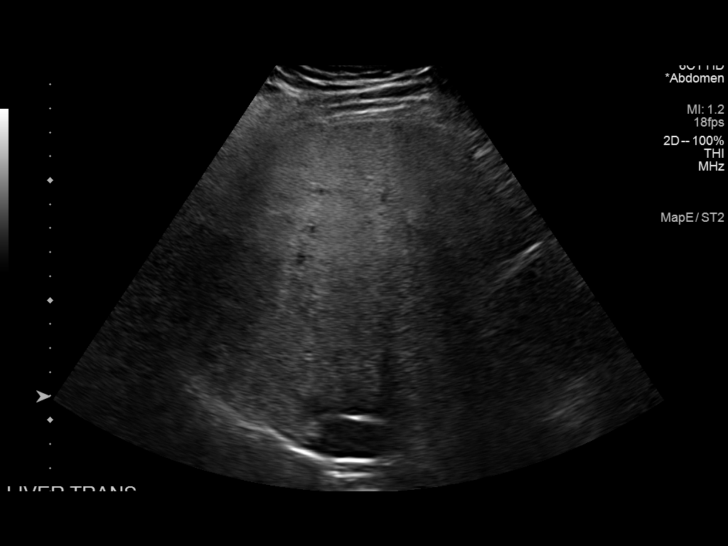
[im 51/61]
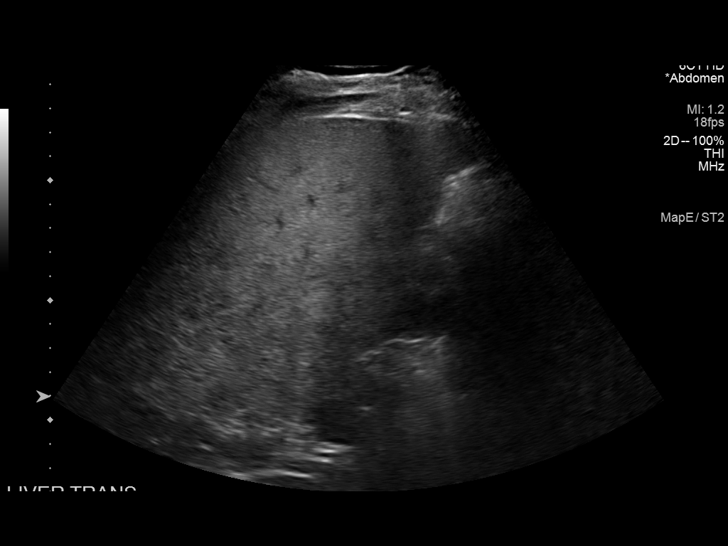
[im 56/61]
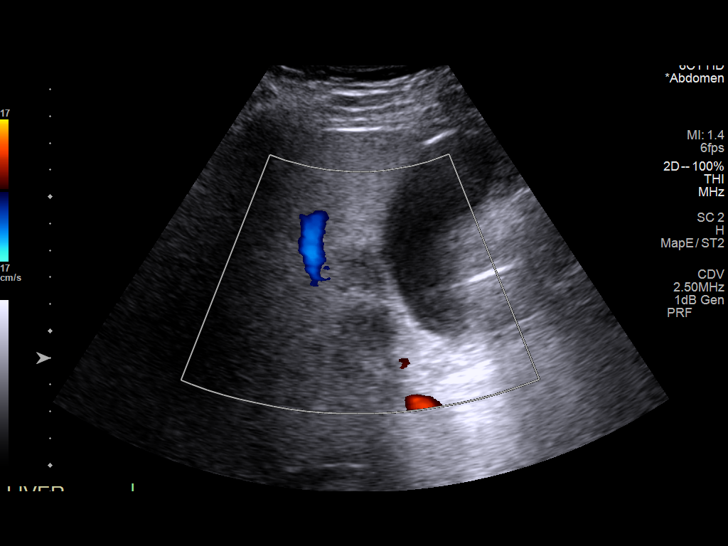
[im 61/61]
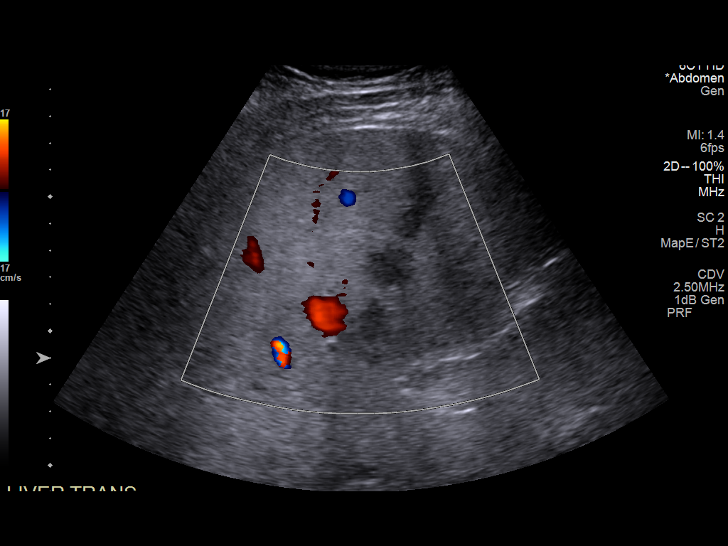

[14 of 25 positions shown; findings below may reference images not displayed]

FINDINGS: Gallbladder:

No gallstones or wall thickening visualized. No sonographic Murphy
sign noted by sonographer.

Common bile duct:

Diameter: 2 mm

Liver:

Increased echogenicity. No focal lesion. Fatty sparing adjacent to
the gallbladder fossa. Portal vein is patent on color Doppler
imaging with normal direction of blood flow towards the liver.

Other: None.
IMPRESSION: No cholelithiasis or sonographic evidence for acute cholecystitis.

Increased hepatic parenchymal echogenicity suggestive of steatosis.
